# Patient Record
Sex: Female | Born: 1940
Health system: Southern US, Community
[De-identification: ages and names within clinical notes are randomized; demographics above are authoritative.]

## PROBLEM LIST (undated history)

## (undated) DIAGNOSIS — I10 Essential (primary) hypertension: Secondary | ICD-10-CM

## (undated) DIAGNOSIS — E119 Type 2 diabetes mellitus without complications: Secondary | ICD-10-CM

## (undated) DIAGNOSIS — E785 Hyperlipidemia, unspecified: Secondary | ICD-10-CM

## (undated) DIAGNOSIS — R002 Palpitations: Secondary | ICD-10-CM

## (undated) HISTORY — DX: Palpitations: R00.2

## (undated) HISTORY — DX: Essential (primary) hypertension: I10

## (undated) HISTORY — DX: Hyperlipidemia, unspecified: E78.5

## (undated) HISTORY — DX: Type 2 diabetes mellitus without complications: E11.9

## (undated) HISTORY — PX: TRANSTHORACIC ECHOCARDIOGRAM: SHX275

---

## 2002-03-04 ENCOUNTER — Other Ambulatory Visit: Admission: RE | Admit: 2002-03-04 | Discharge: 2002-03-04 | Payer: Self-pay | Admitting: Obstetrics and Gynecology

## 2003-08-05 ENCOUNTER — Other Ambulatory Visit: Admission: RE | Admit: 2003-08-05 | Discharge: 2003-08-05 | Payer: Self-pay | Admitting: Obstetrics and Gynecology

## 2004-11-02 ENCOUNTER — Other Ambulatory Visit: Admission: RE | Admit: 2004-11-02 | Discharge: 2004-11-02 | Payer: Self-pay | Admitting: Obstetrics and Gynecology

## 2005-10-17 ENCOUNTER — Encounter: Admission: RE | Admit: 2005-10-17 | Discharge: 2005-10-17 | Payer: Self-pay | Admitting: Endocrinology

## 2006-06-13 ENCOUNTER — Other Ambulatory Visit: Admission: RE | Admit: 2006-06-13 | Discharge: 2006-06-13 | Payer: Self-pay | Admitting: Obstetrics and Gynecology

## 2013-09-08 ENCOUNTER — Encounter: Payer: Self-pay | Admitting: Cardiology

## 2013-09-08 ENCOUNTER — Ambulatory Visit (INDEPENDENT_AMBULATORY_CARE_PROVIDER_SITE_OTHER): Payer: Medicare Other | Admitting: Cardiology

## 2013-09-08 VITALS — BP 160/80 | Ht 63.5 in | Wt 155.0 lb

## 2013-09-08 DIAGNOSIS — E785 Hyperlipidemia, unspecified: Secondary | ICD-10-CM

## 2013-09-08 DIAGNOSIS — I358 Other nonrheumatic aortic valve disorders: Secondary | ICD-10-CM

## 2013-09-08 DIAGNOSIS — E1169 Type 2 diabetes mellitus with other specified complication: Secondary | ICD-10-CM

## 2013-09-08 DIAGNOSIS — I359 Nonrheumatic aortic valve disorder, unspecified: Secondary | ICD-10-CM

## 2013-09-08 DIAGNOSIS — I10 Essential (primary) hypertension: Secondary | ICD-10-CM

## 2013-09-08 NOTE — Patient Instructions (Signed)
Your physician has requested that you have an echocardiogram. Echocardiography is a painless test that uses sound waves to create images of your heart. It provides your doctor with information about the size and shape of your heart and how well your heart's chambers and valves are working. This procedure takes approximately one hour. There are no restrictions for this procedure.  Your physician wants you to follow-up in 6 month Dr Herbie Baltimore.  You will receive a reminder letter in the mail two months in advance. If you don't receive a letter, please call our office to schedule the follow-up appointment.

## 2013-09-08 NOTE — Assessment & Plan Note (Signed)
Her blood pressure is not so well controlled today, she said was better at her primary's office earlier this week. He has been somewhat stressed coming into the office today, and forgot her medicines this morning. She says at home her blood pressures run in the 130s/70s mmHg.  I am inclined to monitor her pressures, and would not react with one reading. I would ask that she have blood pressure checked when she comes get her echocardiogram.

## 2013-09-08 NOTE — Progress Notes (Signed)
PATIENT: Kathy [name] MRN: [id number]  DOB: 12-Dec-1940   DOV:09/08/2013 PCP: Michiel Sites, MD  Clinic Note: Chief Complaint  Patient presents with  . Follow-up    Woke up Sunday could hardly move on lft side-like she pulled a muscle or slept wrong. C/o little ankle edema.    HPI: Kathy [name] is a 72 y.o.  female with a PMH below who presents today for what amounts to be a one-year followup for hypertension, and reported history of hypertensive cardiomyopathy in the past. This however it had been disproved by echocardiogram in 2010. She does have an aortic murmur with echo cardiac evidence of aortic sclerosis but no stenosis in 2010. I last saw her in October 2013, she is relatively stable at that time. Her diabetes and noted to be monitored by her primary physician. She does have labs today..  Interval History: She test today doing relatively well for cardiac standpoint. Her major complaint is that she woke up on Saturday with significant strain in the side of her neck. She's been troubled by a tendinitis in the left elbow and thinks that she may just telephone his sleeping. Other than that she really has no major symptoms. Mild arthralgias in her in her elbows shoulders and knees, but no chest tightness or pressure with or without shortness breath at rest or exertion. Minimal palpitations but no rapid heartbeats. No lightheadedness, dizziness, wooziness, or syncope/near-syncope. No TIA or amaurosis fugax symptoms. She does get occasional leg cramps, but denies any claudication. No melena, hematochezia or hematuria.  Past Medical History  Diagnosis Date  . HTN (hypertension), benign     Prior History of Presumed Hypertensive Cardiomyopathy with LVH - not confrimed by Echo in 2010  . Dyslipidemia   . DM type 2 (diabetes mellitus, type 2)   . Heart palpitations     No documented Arrhythmia    Prior Cardiac Evaluation and Past Surgical History: Past Surgical History    Procedure Laterality Date  . Transthoracic echocardiogram  2010    Normal LV thickness and size. EF 65-75%. Hyperdynamic. Much less notable LVH; mild aortic sclerosis with mild aortic insufficiency. No suggestion of hemodynamically significant stenosis.    Allergies  Allergen Reactions  . Corn-Containing Products   . Ibuprofen   . Penicillins   . Shellfish Allergy   . Soy Allergy     Current Outpatient Prescriptions  Medication Sig Dispense Refill  . aspirin EC 81 MG tablet Take 81 mg by mouth as needed.      . Cholecalciferol (VITAMIN D3) 2000 UNITS capsule Take 2,000 Units by mouth daily.      . Coenzyme Q10 (CO Q 10) 100 MG CAPS Take 1 capsule by mouth daily.      Marland Kitchen glyBURIDE-metformin (GLUCOVANCE) 2.5-500 MG per tablet Take 2 tablets by mouth daily with breakfast.      . Multiple Vitamin (MULTIVITAMIN) tablet Take 1 tablet by mouth daily.      . nebivolol (BYSTOLIC) 10 MG tablet Take 10 mg by mouth daily.      . Omega-3 Fatty Acids (EMULSIFIED OMEGA-3) 409-8119 MG/15ML LIQD Takes 2 teaspoons by mouth daily.      . rosuvastatin (CRESTOR) 10 MG tablet Take 10 mg by mouth daily.      . valsartan-hydrochlorothiazide (DIOVAN-HCT) 320-12.5 MG per tablet Take 1 tablet by mouth daily.       No current facility-administered medications for this visit.    History   Social History Narrative  Married, mother of 2.   He does not smoke or drink alcohol.   She tries to exercise, has been not as compliant as she had in the past.         ROS: A comprehensive Review of Systems - Negative except Symptoms noted in history of present illness, most notably the left neck tension/muscle strain.  PHYSICAL EXAM BP 160/80  Ht 5' 3.5" (1.613 m)  Wt 155 lb (70.308 kg)  BMI 27.02 kg/m2 General appearance: Alert and awake x3 healthy-appearing woman in no acute distress. Well-nourished and well-groomed. Answers questions appropriately HEENT: [place]/AT, EOMI, MMM, anicteric sclera Neck: no  adenopathy, no carotid bruit, no JVD; supple, with full range of motion. There is significant muscle tension along the lesser, mastoid and trapezius muscles. Radiated murmur Lungs: clear to auscultation bilaterally, normal percussion bilaterally and Nonlabored, good air movement Heart: normal apical impulse, regular rate and rhythm, S1, S2 normal, no S3 or S4 and 2/6 early peaking crescendo-decrescendo SEM heard at the RUSB Abdomen: soft, non-tender; bowel sounds normal; no masses,  no organomegaly Extremities: extremities normal, atraumatic, no cyanosis or edema Pulses: 2+ and symmetric Neurologic: Grossly normal  QIO:[id number] today: Yes Rate: 83 , Rhythm: NSR, normal ECG;  Recent Labs: 08/18/2013  CBC: BC 6.0, Hgb 10.5, platelets 223  LFTs: Normal  Creatinine 1.05 (GFR greater than 59); glucose 184 (compared to 248 in September)  Total cholesterol 112, HDL 35 , LDL 62, triglycerides 77 reduced from (207, 40,144, 116 respectively)   ASSESSMENT / PLAN: Aortic heart murmur Although her previous echocardiogram was read as having aortic sclerosis, my previous examinations did not note a significant murmur in the aortic region. This is the first time did notice and was at least 2-3/6 which would suggest that sclerosis has progressed to stenosis.  Plan: Recheck transthoracic echocardiogram to assess status of aortic valve with worsening murmur  HTN (hypertension) Her blood pressure is not so well controlled today, she said was better at her primary's office earlier this week. He has been somewhat stressed coming into the office today, and forgot her medicines this morning. She says at home her blood pressures run in the 130s/70s mmHg.  I am inclined to monitor her pressures, and would not react with one reading. I would ask that she have blood pressure checked when she comes get her echocardiogram.  Dyslipidemia associated with type 2 diabetes mellitus Relatively well controlled on most  recent labs. The only level not at goal would be the HDL, and that reduced level may simply be because of total cholesterol being significantly reduced.  She is on Crestor along with IV fatty acids and coenzyme Q10, and tolerating the regimen relatively well-appearing    Orders Placed This Encounter  Procedures  . EKG 12-Lead  . 2D Echocardiogram without contrast    Standing Status: Future     Number of Occurrences:      Standing Expiration Date: 09/08/2014    Order Specific Question:  Type of Echo    Answer:  Complete    Order Specific Question:  Where should this test be performed    Answer:  [facility]    Order Specific Question:  Reason for exam-Echo    Answer:  Murmur  785.2    Followup: 6 months  DAVID W. Herbie Baltimore, M.D., M.S. [facility] [address] [place], [place]  [address]  [phone] Pager # [phone]

## 2013-09-08 NOTE — Assessment & Plan Note (Signed)
Relatively well controlled on most recent labs. The only level not at goal would be the HDL, and that reduced level may simply be because of total cholesterol being significantly reduced.  She is on Crestor along with IV fatty acids and coenzyme Q10, and tolerating the regimen relatively well-appearing

## 2013-09-08 NOTE — Assessment & Plan Note (Signed)
Although her previous echocardiogram was read as having aortic sclerosis, my previous examinations did not note a significant murmur in the aortic region. This is the first time did notice and was at least 2-3/6 which would suggest that sclerosis has progressed to stenosis.  Plan: Recheck transthoracic echocardiogram to assess status of aortic valve with worsening murmur

## 2013-09-16 ENCOUNTER — Telehealth (HOSPITAL_COMMUNITY): Payer: Self-pay | Admitting: *Deleted

## 2013-09-16 NOTE — Telephone Encounter (Signed)
I have no idea what this medication is.  It is a new - not yet on [facility]. The only potential interaction with her current medications is with the HCTZ component of her Valsartan (Diovan)-HCTZ.     I have no experience with this medication & would defer to her PCP.  Please help her get echo rescheduled.  Marykay Lex, MD

## 2013-09-16 NOTE — Telephone Encounter (Signed)
Pt states that she was prescribed Jardiance 10mg  by another physician and her body is not reacting well to it. She would like to speak with Dr. Herbie Baltimore about it. Please call

## 2013-09-16 NOTE — Telephone Encounter (Signed)
Returned call and pt verified x 2.  Pt stated she was supposed to be scheduled for an echocardiogram and never received a call to schedule it.  Pt informed she will be transferred to [facility] who schedules testing to have this scheduled.  Pt also informed message received about her medication.  Pt stated she wanted to let Dr. Herbie Baltimore know the name of the medication, Jardiance, b/c they discussed this at her appt last week.  Stated her PCP started her on this and she took it for 3 days and stopped.  Stated she wanted to let Dr. Herbie Baltimore know so he could give her a second opinion on this med, if she should be taking it or not and if it will interact with anything else she is taking.  Pt informed Dr. Herbie Baltimore will be notified.  Pt verbalized understanding and agreed w/ plan.  Pt transferred to [facility].  Message forwarded to Dr. Herbie Baltimore.

## 2013-09-16 NOTE — Telephone Encounter (Signed)
Returned call and informed pt per instructions by MD.  Pt verbalized understanding and agreed w/ plan.   Dr. Herbie Baltimore - This is a new medication.  I had to look it up online.  Also, she was scheduled for the echo.

## 2013-09-19 ENCOUNTER — Encounter: Payer: Self-pay | Admitting: Cardiology

## 2013-09-20 NOTE — Telephone Encounter (Signed)
Thanks.  I looked it up, but have no idea as to the side effects etc.  Her PCP prescribed the medication.  I defer to the PCP.  Leonie Man, MD

## 2013-09-29 ENCOUNTER — Ambulatory Visit (HOSPITAL_COMMUNITY)
Admission: RE | Admit: 2013-09-29 | Discharge: 2013-09-29 | Disposition: A | Discharge status: Home / Self Care | Payer: Medicare HMO | Source: Ambulatory Visit | Attending: Cardiology | Admitting: Cardiology

## 2013-09-29 DIAGNOSIS — I358 Other nonrheumatic aortic valve disorders: Secondary | ICD-10-CM

## 2013-09-29 DIAGNOSIS — E119 Type 2 diabetes mellitus without complications: Secondary | ICD-10-CM | POA: Insufficient documentation

## 2013-09-29 DIAGNOSIS — R002 Palpitations: Secondary | ICD-10-CM | POA: Insufficient documentation

## 2013-09-29 DIAGNOSIS — R011 Cardiac murmur, unspecified: Secondary | ICD-10-CM

## 2013-09-29 DIAGNOSIS — I359 Nonrheumatic aortic valve disorder, unspecified: Secondary | ICD-10-CM | POA: Diagnosis not present

## 2013-09-29 DIAGNOSIS — I10 Essential (primary) hypertension: Secondary | ICD-10-CM | POA: Insufficient documentation

## 2013-09-29 DIAGNOSIS — E785 Hyperlipidemia, unspecified: Secondary | ICD-10-CM | POA: Insufficient documentation

## 2013-09-29 NOTE — Progress Notes (Signed)
2D Echo Performed 09/29/2013    Brennah Quraishi, RCS  

## 2013-09-30 ENCOUNTER — Telehealth: Payer: Self-pay | Admitting: *Deleted

## 2013-09-30 NOTE — Telephone Encounter (Signed)
Spoke to patient.  ECHO Result given . Verbalized understanding  

## 2013-09-30 NOTE — Telephone Encounter (Signed)
Message copied by Raiford Simmonds on Tue Sep 30, 2013  6:13 PM ------      Message from: Leonie Man      Created: Mon Sep 29, 2013  5:49 PM       Good news - normal pump function.  The Aortic valve does not have signs of narrowing.  It is a bit "leaky", but not significant.  Will simply monitor the murmur & symptoms.      Leonie Man, MD       ------

## 2013-10-15 ENCOUNTER — Encounter: Payer: Self-pay | Admitting: *Deleted

## 2013-11-10 ENCOUNTER — Telehealth: Payer: Self-pay | Admitting: Cardiology

## 2013-11-10 NOTE — Telephone Encounter (Signed)
Easiest course of action is to do a PA for Brand Name Diovan without HCTZ.  Otherwise, will need to convert to a different ARB like Irbesartan.   Will forward to [place] to see if she ideas.  If we do this, should have a BP follow-up after changing.  Leonie Man, MD

## 2013-11-10 NOTE — Telephone Encounter (Signed)
Has questions about a medication in which she is taking , her insurance does not want pay for the brand name , the generic form of diovan kept her dizzy and she kept falling , and the insurance needs a reason to show why she cant take the generic form of Diovan .Marland Kitchen Please Call .Marland Kitchen    Thanks

## 2013-11-10 NOTE — Telephone Encounter (Signed)
Returned call and pt verified x 2.  Informed pt message received and forwarded to Dr. Sharlyn Bologna, RN.  Pt stated she has a few more days of medication left.  Stated Dr. Ellyn Hack had her changed from the generic b/c she was having dizziness and falls when taking it.  Stated she thinks it's the fillers in the generic that she has a problem w/ and cannot take it.    Last Rx was written by PCP at her annual visit, but stated Dr. Ellyn Hack is the one who initially prescribed it.    Message forwarded to Dr. Sharlyn Bologna, RN.  Sound like pt needs PA for Brand Name Diovan HCT.

## 2013-11-17 NOTE — Telephone Encounter (Signed)
Verified ID # with patient,  Tried to run PA last week (Wednesday), came back as pt not on insurance.  Will try again today.

## 2013-11-18 MED ORDER — CANDESARTAN CILEXETIL-HCTZ 32-12.5 MG PO TABS: 1.0000 | ORAL_TABLET | Freq: Every day | ORAL | Status: DC

## 2013-11-18 NOTE — Telephone Encounter (Signed)
Spoke with patient, [employer] denied PA.  Advised pt we would try generic of another similar medication.  She states that she has had multiple problems in the past with other classes of medications, including beta blocker and calcium channel blockers.  I explained that we would use irbeasrtan hct.  She then stated that part of the problem lies in tablet size and she won't take any "large" tablets as she has swallowing difficulties.  So I will start her on candesartan/hct in hopes that the tablet size is agreeable with her.  [facility] day [facility]  I did advise her to take it for at least 2 weeks before determining if it was not acceptable.

## 2013-12-26 ENCOUNTER — Telehealth: Payer: Self-pay | Admitting: Cardiology

## 2013-12-26 NOTE — Telephone Encounter (Signed)
Message forwarded to Kristin Alvstad, PharmD.  

## 2013-12-26 NOTE — Telephone Encounter (Signed)
Has a question about Atacand/HCTZ 32/12.5 mg and has been on Diovan 320/12.5 mg and she does not believe that they are equivalent and want the pharmacist to call and ask if Dr.Harding call in the right medication for her . And if you prefer you can call the patient directly and let her know , because everything at the pharmacy is good . But any questions you can call the pharmacist Marshia Ly at [phone].   Thanks

## 2013-12-26 NOTE — Telephone Encounter (Signed)
Pt was confused about switching from a 320 mg drug to 32 mg drug.  Explained that these doses are equivalent and that it was not an error by the pharmacy.  Pt voiced understanding and said she would call if she develops problems with the Atacand HCT, which she will start today.

## 2014-02-23 ENCOUNTER — Other Ambulatory Visit: Payer: Self-pay | Admitting: *Deleted

## 2014-02-23 ENCOUNTER — Telehealth: Payer: Self-pay | Admitting: Cardiology

## 2014-02-23 MED ORDER — CANDESARTAN CILEXETIL-HCTZ 32-12.5 MG PO TABS: 1.0000 | ORAL_TABLET | Freq: Every day | ORAL | Status: DC

## 2014-02-23 NOTE — Telephone Encounter (Signed)
RN spoke to patient. She states she has taken medication for the last #30 days.she states it makes her anxious and sick.  She has tried different times of the day she still has the same experience.  She would like to keep taking the medication until she sees Dr Ellyn Hack at office appointment.  RN asked if patient took b/p today, she states no. Patient  states she need medication for today.  Prescription  E-sent earlier in the day.

## 2014-02-23 NOTE — Telephone Encounter (Signed)
Forward to Kathy [name]

## 2014-02-23 NOTE — Telephone Encounter (Signed)
Make sure that Bystolic & Diovan are taken at 12 hr interval between/

## 2014-02-23 NOTE — Telephone Encounter (Signed)
Patient needs to talk about the Cardesartem/HCTZ  32-12.5 that she is taking.  She states that it makes her sick and nervous.   Please call.

## 2014-02-23 NOTE — Telephone Encounter (Signed)
Called patient no answer.will place back in triage pool

## 2014-02-24 NOTE — Telephone Encounter (Signed)
Spoke to patient. Per Dr Ellyn Hack, CLARIFICATION  -patient is taking (GENERIC-ATCAND-HCTZ) not DIOVAN.  APPOINTMENT IHAS BEEN MOVED UP TO 03/10/14-11:45 PM - PATIENT AWARE. RN INFORMED PATIENT PER DR HARDING TO TAKE 12 HOURS  APART FROM BYSTOLIC. SHE VERBALIZED UNDERSTANDING.

## 2014-03-10 ENCOUNTER — Ambulatory Visit: Payer: Medicare HMO | Admitting: Cardiology

## 2014-03-16 ENCOUNTER — Ambulatory Visit: Payer: Medicare HMO | Admitting: Cardiology

## 2014-03-31 ENCOUNTER — Telehealth: Payer: Self-pay | Admitting: Cardiology

## 2014-03-31 NOTE — Telephone Encounter (Signed)
Pt need her Candesartan  HCTZ 32/12.5 #30. Pharmacist says she needs  prior authorization. [phone].

## 2014-04-01 NOTE — Telephone Encounter (Signed)
Prior authorization form faxed to [place] for approval

## 2014-04-09 ENCOUNTER — Telehealth: Payer: Self-pay | Admitting: *Deleted

## 2014-04-09 NOTE — Telephone Encounter (Signed)
Called and verified with pharmacy - medication was approved per  pharmacy personnel  Medication was filled on 04/01/15

## 2014-04-13 ENCOUNTER — Ambulatory Visit: Payer: Medicare HMO | Admitting: Cardiology

## 2014-05-18 ENCOUNTER — Ambulatory Visit: Payer: Medicare HMO | Admitting: Cardiology

## 2014-05-26 ENCOUNTER — Ambulatory Visit (INDEPENDENT_AMBULATORY_CARE_PROVIDER_SITE_OTHER): Payer: PRIVATE HEALTH INSURANCE | Admitting: Cardiology

## 2014-05-26 VITALS — BP 168/82 | HR 82 | Ht 64.0 in | Wt 153.4 lb

## 2014-05-26 DIAGNOSIS — I358 Other nonrheumatic aortic valve disorders: Secondary | ICD-10-CM

## 2014-05-26 DIAGNOSIS — E1169 Type 2 diabetes mellitus with other specified complication: Secondary | ICD-10-CM

## 2014-05-26 DIAGNOSIS — E785 Hyperlipidemia, unspecified: Secondary | ICD-10-CM

## 2014-05-26 DIAGNOSIS — I10 Essential (primary) hypertension: Secondary | ICD-10-CM

## 2014-05-26 DIAGNOSIS — I359 Nonrheumatic aortic valve disorder, unspecified: Secondary | ICD-10-CM

## 2014-05-26 MED ORDER — DIOVAN HCT 320-25 MG PO TABS: 1.0000 | ORAL_TABLET | Freq: Every day | ORAL | Status: DC

## 2014-05-26 NOTE — Patient Instructions (Addendum)
CHANGE  (BRAND ) DIOVAN HCT 320/25 MG   STOP CANDESARTAN-HCT  Your physician wants you to follow-up in [place] will receive a reminder letter in the mail two months in advance. If you don't receive a letter, please call our office to schedule the follow-up appointment.

## 2014-05-31 ENCOUNTER — Encounter: Payer: Self-pay | Admitting: Cardiology

## 2014-05-31 NOTE — Assessment & Plan Note (Signed)
Not adequately controlled. Unfortunately, she has this presumption that generic medications do not go along well with her. As a very unfortunate because we had to do a lot of paperwork is to get her approved for the Atacand and HCTZ. At this point in time this class of medication is best to do for her blood pressure control. We will try to get her back on Diovan HCT at her previous dose. Continue with diastolic of 10 mg daily which could be increased to 20 mg if her blood pressure still is elevated.

## 2014-05-31 NOTE — Assessment & Plan Note (Signed)
Repeat echo showed less aortic sclerosis than last time. She does have some mild AI but does not we heard. And minimal disease, noted at all concerned. We will recheck if murmur is louder.

## 2014-05-31 NOTE — Progress Notes (Signed)
PCP: Dwan Bolt, MD  Clinic Note: Chief Complaint  Patient presents with  . Follow-up    6 months follow-up, pt stated candesartan does not work well for her it make her feel dizzy and lightheaded and feel like she;s walking side ways.   HPI: Kathy [name] is a 73 y.o. female with a PMH below who presents today for 9 month followup of hypertension with presumed hypertensive cardiomyopathy that is likely inaccurate based on most recent echo. She has a history of intolerance to generic medications. Since her last visit we had switched her from trade name Diovan-HCTZ to generic candesartan-HCTZ due to her insurance coverage. Unfortunately she feels that she is having a bad interaction with the "filler ingredients of the generic medication".  Past Medical History  Diagnosis Date  . HTN (hypertension), benign     Prior History of Presumed Hypertensive Cardiomyopathy with LVH - not confrimed by Echo in 2010 & 2015  . Dyslipidemia   . DM type 2 (diabetes mellitus, type 2)   . Heart palpitations     No documented Arrhythmia   Prior Cardiac Evaluation and Past Surgical History: Past Surgical History  Procedure Laterality Date  . Transthoracic echocardiogram  2010; 09/2013    Normal LV thickness and size. EF 65-75%. Hyperdynamic. Much less notable LVH; mild aortic sclerosis with mild aortic insufficiency. No suggestion of hemodynamically significant stenosis.; b) 2015 - NormalLV size & function, EF 60-65% with Gr 1 DD, mild Ao Sclerosis with mild-mod AI    Interval History: She states that she is "just not been feeling good since we changed the medications. "She feels as though she is lightheaded and dizzy, walking sideways". She feels short of breath at rest, not associated with exertion.. She has shooting pains down her left arm on occasion. She has not had any syncope or near syncopal episodes. No TIA/amaurosis fugax or CVA symptoms. No chest tightness or pressure with rest or  exertion. No PND, orthopnea or edema. No claudication. She has not noted any significant palpitations are rapid slightly irregular heartbeats. She is under great stress because her husband is in the hospital.  ROS: A comprehensive Review of Systems - was performed Review of Systems  Constitutional: Positive for malaise/fatigue. Negative for fever, chills, weight loss and diaphoresis.  HENT: Negative for congestion, nosebleeds and tinnitus.   Eyes: Positive for blurred vision. Negative for double vision, photophobia and pain.  Respiratory: Positive for shortness of breath. Negative for cough, hemoptysis, sputum production and wheezing.   Cardiovascular: Positive for claudication.       Per history of present illness  Gastrointestinal: Positive for nausea. Negative for vomiting, abdominal pain, diarrhea, constipation, blood in stool and melena.  Neurological: Positive for dizziness, tingling and weakness. Negative for tremors, sensory change, speech change, focal weakness, seizures, loss of consciousness and headaches.  Endo/Heme/Allergies: Negative.  Does not bruise/bleed easily.  Psychiatric/Behavioral: Negative for depression, suicidal ideas, hallucinations, memory loss and substance abuse. The patient is nervous/anxious. The patient does not have insomnia.   All other systems reviewed and are negative.  MEDICATIONS AND ALLERGIES REVIEWED IN EPIC --  ON Candasartan-HCTZ 32-12.5 in lieu of Diovan-HCT 320-12.5 (trade name)  SOCIAL AND FAMILY HISTORY REVIEWED IN EPIC -- No change  Wt Readings from Last 3 Encounters:  05/26/14 153 lb 6.4 oz (69.582 kg)  09/08/13 155 lb (70.308 kg)   PHYSICAL EXAM BP 168/82  Pulse 82  Ht 5\' 4"  (1.626 m)  Wt 153 lb 6.4 oz (69.582  kg)  BMI 26.32 kg/m2 General appearance: Alert and awake x3 healthy-appearing woman in no acute distress. Well-nourished and well-groomed. Answers questions appropriately  HEENT: [place]/AT, EOMI, MMM, anicteric sclera  Neck: no  adenopathy, no carotid bruit, no JVD; supple, with full range of motion. There is significant muscle tension along the lesser, mastoid and trapezius muscles. Radiated murmur  Lungs: clear to auscultation bilaterally, normal percussion bilaterally and Nonlabored, good air movement  Heart: normal apical impulse, regular rate and rhythm, S1, S2 normal, no S3 or S4 and 2/6 early peaking crescendo-decrescendo SEM heard at the RUSB  Abdomen: soft, non-tender; bowel sounds normal; no masses, no organomegaly  Extremities: extremities normal, atraumatic, no cyanosis or edema  Pulses: 2+ and symmetric  Neurologic: Grossly normal   Adult ECG Report  Rate: 82 ;  Rhythm: normal sinus rhythm  Narrative Interpretation: Normal EKG.  No change from previous  Recent Labs: 08/18/2013  CBC: BC 6.0, Hgb 10.5, platelets 223  LFTs: Normal  Creatinine 1.05 (GFR greater than 59); glucose 184 (compared to 248 in September)  Total cholesterol 112, HDL 35 , LDL 62, triglycerides 77 reduced from (207, 40,144, 116 respectively)    ASSESSMENT / PLAN: Essential hypertension, malignant Not adequately controlled. Unfortunately, she has this presumption that generic medications do not go along well with her. As a very unfortunate because we had to do a lot of paperwork is to get her approved for the Atacand and HCTZ. At this point in time this class of medication is best to do for her blood pressure control. We will try to get her back on Diovan HCT at her previous dose. Continue with diastolic of 10 mg daily which could be increased to 20 mg if her blood pressure still is elevated.  Aortic heart murmur Repeat echo showed less aortic sclerosis than last time. She does have some mild AI but does not we heard. And minimal disease, noted at all concerned. We will recheck if murmur is louder.  Dyslipidemia associated with type 2 diabetes mellitus Lipid panel looked pretty well back in December. Upon going to check her once a  year. Continue Crestor with omega-3 fatty acids and Co Q10.    Orders Placed This Encounter  Procedures  . EKG 12-Lead   Meds ordered this encounter  Medications  . DIOVAN HCT 320-25 MG per tablet    Sig: Take 1 tablet by mouth daily.    Dispense:  30 tablet    Refill:  11    Followup: RN BP check in ~1 month.  6 month with MD   Leonie Man, M.D., M.S. Interventional Cardiologist   Pager # [phone]

## 2014-05-31 NOTE — Assessment & Plan Note (Signed)
Lipid panel looked pretty well back in December. Upon going to check her once a year. Continue Crestor with omega-3 fatty acids and Co Q10.

## 2014-06-12 ENCOUNTER — Other Ambulatory Visit: Payer: Self-pay | Admitting: Cardiology

## 2014-06-12 NOTE — Telephone Encounter (Signed)
E sentrx 

## 2014-06-30 ENCOUNTER — Telehealth: Payer: Self-pay | Admitting: *Deleted

## 2014-06-30 NOTE — Telephone Encounter (Signed)
NOTIFIED PHARMACY DIOVAN -HCT (BRAND) HAS BEEN APRROVED

## 2014-06-30 NOTE — Telephone Encounter (Signed)
Spoke to representative-Crystal Information given - ICD-10--I10 for malignant hypertension ,patient tried generic VALSARTAN- (PATIENT IS INTOLERANT TO GENERIC MEDICATIONS ) PRIOR APPROVED. FOR  A YEAR.

## 2014-12-03 DIAGNOSIS — E11329 Type 2 diabetes mellitus with mild nonproliferative diabetic retinopathy without macular edema: Secondary | ICD-10-CM | POA: Diagnosis not present

## 2014-12-03 DIAGNOSIS — H524 Presbyopia: Secondary | ICD-10-CM | POA: Diagnosis not present

## 2014-12-14 DIAGNOSIS — E118 Type 2 diabetes mellitus with unspecified complications: Secondary | ICD-10-CM | POA: Diagnosis not present

## 2014-12-14 DIAGNOSIS — E789 Disorder of lipoprotein metabolism, unspecified: Secondary | ICD-10-CM | POA: Diagnosis not present

## 2014-12-21 DIAGNOSIS — E118 Type 2 diabetes mellitus with unspecified complications: Secondary | ICD-10-CM | POA: Diagnosis not present

## 2014-12-21 DIAGNOSIS — E789 Disorder of lipoprotein metabolism, unspecified: Secondary | ICD-10-CM | POA: Diagnosis not present

## 2014-12-21 DIAGNOSIS — I10 Essential (primary) hypertension: Secondary | ICD-10-CM | POA: Diagnosis not present

## 2014-12-28 ENCOUNTER — Ambulatory Visit (INDEPENDENT_AMBULATORY_CARE_PROVIDER_SITE_OTHER): Payer: Medicare Other | Admitting: Cardiology

## 2014-12-28 VITALS — BP 134/80 | HR 78 | Ht 64.0 in | Wt 150.1 lb

## 2014-12-28 DIAGNOSIS — E1169 Type 2 diabetes mellitus with other specified complication: Secondary | ICD-10-CM

## 2014-12-28 DIAGNOSIS — E785 Hyperlipidemia, unspecified: Secondary | ICD-10-CM

## 2014-12-28 DIAGNOSIS — R002 Palpitations: Secondary | ICD-10-CM | POA: Diagnosis not present

## 2014-12-28 DIAGNOSIS — I359 Nonrheumatic aortic valve disorder, unspecified: Secondary | ICD-10-CM

## 2014-12-28 DIAGNOSIS — I358 Other nonrheumatic aortic valve disorders: Secondary | ICD-10-CM

## 2014-12-28 DIAGNOSIS — I10 Essential (primary) hypertension: Secondary | ICD-10-CM

## 2014-12-28 MED ORDER — DIOVAN HCT 320-12.5 MG PO TABS: 1.0000 | ORAL_TABLET | Freq: Every day | ORAL | Status: DC

## 2014-12-28 MED ORDER — VALSARTAN-HYDROCHLOROTHIAZIDE 320-12.5 MG PO TABS: 1.0000 | ORAL_TABLET | Freq: Every day | ORAL | Status: DC

## 2014-12-28 NOTE — Progress Notes (Signed)
PCP: Dwan Bolt, MD  Clinic Note: Chief Complaint  Patient presents with  . Follow-up    6 Months follow-up  . Hypertension  . Palpitations    HPI: Kathy [name] is a 74 y.o. female with a PMH below who presents today for 6 month follow-up for HTN, palpitations & HLD.  Past Medical History  Diagnosis Date  . HTN (hypertension), benign     Prior History of Presumed Hypertensive Cardiomyopathy with LVH - not confrimed by Echo in 2010 & 2015  . Dyslipidemia   . DM type 2 (diabetes mellitus, type 2)   . Heart palpitations     No documented Arrhythmia    Prior Cardiac Evaluation and Past Surgical History: Past Surgical History  Procedure Laterality Date  . Transthoracic echocardiogram  2010; 09/2013    Normal LV thickness and size. EF 65-75%. Hyperdynamic. Much less notable LVH; mild aortic sclerosis with mild aortic insufficiency. No suggestion of hemodynamically significant stenosis.; b) 2015 - NormalLV size & function, EF 60-65% with Gr 1 DD, mild Ao Sclerosis with mild-mod AI    Interval History: She seems to be doing relatively well herself. Her psychological symptoms seem to be better control with her depression. She still is under a lot of stress which MAKES her blood sugars go up. She has been doing more exercising try to take her husband walking at the mall walking around stores like [employer] or [place]. Perhaps a limited most significant stressors for her involved her husband who is recovering from or dealing with ischemic cardiomyopathy. He is finally now agreed to be on antidepressant. She is actively trying to see if she can get him in to see a different cardiologist because she feels that he should be seen more frequently than he is being seen.  From a cardiology standpoint. She denies any resting or exertional chest pain or pressure.  She does have mild intermittent palpitations but are much better when she is less stressed. When she does have levels of stress  or anxiety or she gets upset she will notice more palpitations. She also notes more palpitations if she feels herself becoming more dry and dehydrated. Otherwise for instance when she is walking or exercising she denies any palpitations. No shortness of breath with rest or exertion.  No PND, orthopnea or edema. No lightheadedness, dizziness, weakness,syncope/near syncope, or TIA/amaurosis fugax symptoms.  ROS: A comprehensive was performed. Review of Systems  Constitutional: Positive for weight loss (Partially all-purpose. She has been exercising and trying to watch her diet.).  Respiratory: Negative for cough, shortness of breath and wheezing.   Cardiovascular: Negative for claudication and leg swelling.       Feels dehydrated  Gastrointestinal: Negative for blood in stool and melena.  Genitourinary: Negative for hematuria.       Feels somewhat dry dehydrated  Neurological: Negative for dizziness.  Endo/Heme/Allergies: Does not bruise/bleed easily.  Psychiatric/Behavioral: Positive for depression. The patient is nervous/anxious. The patient does not have insomnia.   All other systems reviewed and are negative.   Current Outpatient Prescriptions on File Prior to Visit  Medication Sig Dispense Refill  . aspirin EC 81 MG tablet Take 81 mg by mouth as needed.    Marland Kitchen BYSTOLIC 10 MG tablet TAKE 1 TABLET EVERY DAY 90 tablet 3  . Cholecalciferol (VITAMIN D3) 2000 UNITS capsule Take 2,000 Units by mouth daily.    . Coenzyme Q10 (CO Q 10) 100 MG CAPS Take 1 capsule by mouth daily.    Marland Kitchen  glyBURIDE-metformin (GLUCOVANCE) 2.5-500 MG per tablet Take 2 tablets by mouth daily with breakfast.    . Multiple Vitamin (MULTIVITAMIN) tablet Take 1 tablet by mouth daily.    . Omega-3 Fatty Acids (EMULSIFIED OMEGA-3) [id number] MG/15ML LIQD Takes 2 teaspoons by mouth daily.    . rosuvastatin (CRESTOR) 10 MG tablet Take 10 mg by mouth daily.     No current facility-administered medications on file prior to visit.     Allergies  Allergen Reactions  . Corn-Containing Products   . Ibuprofen   . Penicillins   . Shellfish Allergy   . Soy Allergy    History  Substance Use Topics  . Smoking status: Never Smoker   . Smokeless tobacco: Never Used  . Alcohol Use: No   Family History  Problem Relation Age of Onset  . Diabetes Brother   . Arrhythmia Brother   . Diabetes Brother     Wt Readings from Last 3 Encounters:  12/28/14 150 lb 1.6 oz (68.085 kg)  05/26/14 153 lb 6.4 oz (69.582 kg)  09/08/13 155 lb (70.308 kg)    PHYSICAL EXAM BP 134/80 mmHg  Pulse 78  Ht 5\' 4"  (1.626 m)  Wt 150 lb 1.6 oz (68.085 kg)  BMI 25.75 kg/m2 General appearance: Alert and awake x3 healthy-appearing woman in no acute distress. Well-nourished and well-groomed. Answers questions appropriately  HEENT: [place]/AT, EOMI, MMM, anicteric sclera  Neck: no adenopathy, no carotid bruit, no JVD; supple, with full range of motion. There is significant muscle tension along the lesser, mastoid and trapezius muscles. Radiated murmur  Lungs: clear to auscultation bilaterally, normal percussion bilaterally and Nonlabored, good air movement  Heart: normal apical impulse, regular rate and rhythm, S1, S2 normal, no S3 or S4 and 2/6 early peaking crescendo-decrescendo SEM heard at the RUSB  Abdomen: soft, non-tender; bowel sounds normal; no masses, no organomegaly  Extremities: extremities normal, atraumatic, no cyanosis or edema  Pulses: 2+ and symmetric  Neurologic: Grossly normal   Adult ECG Report  Rate: 78 ;  Rhythm: normal sinus rhythm and With normal axis, intervals and durations. Normal voltage  Narrative Interpretation: Normal EKG  Recent Labs:   Labs just checked by PCP. Numbers are not available.   ASSESSMENT / PLAN: Problem List Items Addressed This Visit    Aortic heart murmur (Chronic)    Benign finding of aortic sclerosis. Mild AI not heard on exam. Would only recheck echo if the murmur becomes more prominent.       Relevant Orders   EKG 12-Lead (Completed)   Dyslipidemia associated with type 2 diabetes mellitus (Chronic)    On Crestor and omega-3 fatty acids plus CoQ10-- tolerating well      Relevant Medications   saxagliptin HCl (ONGLYZA) 5 MG TABS tablet   DIOVAN HCT 320-12.5 MG per tablet   Other Relevant Orders   EKG 12-Lead (Completed)   Heart palpitations (Chronic)    Well-controlled on Bystolic, Which works well as she does not have much fatigue      Moderate essential hypertension - Primary    Blood pressure looks good today on current regimen. She is on stable dose of Bystolic which is helpful for palpitations and is better off on the Diovan HCTZ, however she is feeling somewhat dry and dehydrated therefore we will decrease her to the 12.5 mg HCTZ component.      Relevant Medications   DIOVAN HCT 320-12.5 MG per tablet   Other Relevant Orders   EKG 12-Lead (Completed)  Orders Placed This Encounter  Procedures  . EKG 12-Lead   Meds ordered this encounter  Medications  . saxagliptin HCl (ONGLYZA) 5 MG TABS tablet    Sig: Take 5 mg by mouth daily.  Marland Kitchen DISCONTD: valsartan-hydrochlorothiazide (DIOVAN HCT) 320-12.5 MG per tablet    Sig: Take 1 tablet by mouth daily.    Dispense:  90 tablet    Refill:  3  . DIOVAN HCT 320-12.5 MG per tablet    Sig: Take 1 tablet by mouth daily.    Dispense:  90 tablet    Refill:  3    Followup: 6 months    HARDING, Leonie Green, M.D., M.S. Interventional Cardiologist   Pager # [phone]

## 2014-12-28 NOTE — Patient Instructions (Signed)
SWITCH TO DIOVAN HCT 320 /12.5 MG DAILY.  CONTINUE ALL OTHER MEDICATIONS.  Your physician wants you to follow-up in [place].  You will receive a reminder letter in the mail two months in advance. If you don't receive a letter, please call our office to schedule the follow-up appointment.

## 2014-12-30 ENCOUNTER — Encounter: Payer: Self-pay | Admitting: Cardiology

## 2014-12-30 DIAGNOSIS — R002 Palpitations: Secondary | ICD-10-CM | POA: Insufficient documentation

## 2014-12-30 NOTE — Assessment & Plan Note (Signed)
On Crestor and omega-3 fatty acids plus CoQ10-- tolerating well

## 2014-12-30 NOTE — Assessment & Plan Note (Signed)
Blood pressure looks good today on current regimen. She is on stable dose of Bystolic which is helpful for palpitations and is better off on the Diovan HCTZ, however she is feeling somewhat dry and dehydrated therefore we will decrease her to the 12.5 mg HCTZ component.

## 2014-12-30 NOTE — Assessment & Plan Note (Signed)
Well-controlled on Bystolic, Which works well as she does not have much fatigue

## 2014-12-30 NOTE — Assessment & Plan Note (Signed)
Benign finding of aortic sclerosis. Mild AI not heard on exam. Would only recheck echo if the murmur becomes more prominent.

## 2015-02-11 DIAGNOSIS — E118 Type 2 diabetes mellitus with unspecified complications: Secondary | ICD-10-CM | POA: Diagnosis not present

## 2015-02-18 DIAGNOSIS — E789 Disorder of lipoprotein metabolism, unspecified: Secondary | ICD-10-CM | POA: Diagnosis not present

## 2015-02-18 DIAGNOSIS — I10 Essential (primary) hypertension: Secondary | ICD-10-CM | POA: Diagnosis not present

## 2015-04-15 DIAGNOSIS — E789 Disorder of lipoprotein metabolism, unspecified: Secondary | ICD-10-CM | POA: Diagnosis not present

## 2015-04-15 DIAGNOSIS — E118 Type 2 diabetes mellitus with unspecified complications: Secondary | ICD-10-CM | POA: Diagnosis not present

## 2015-04-20 DIAGNOSIS — I10 Essential (primary) hypertension: Secondary | ICD-10-CM | POA: Diagnosis not present

## 2015-04-20 DIAGNOSIS — E119 Type 2 diabetes mellitus without complications: Secondary | ICD-10-CM | POA: Diagnosis not present

## 2015-04-20 DIAGNOSIS — E789 Disorder of lipoprotein metabolism, unspecified: Secondary | ICD-10-CM | POA: Diagnosis not present

## 2015-04-28 DIAGNOSIS — L821 Other seborrheic keratosis: Secondary | ICD-10-CM | POA: Diagnosis not present

## 2015-04-28 DIAGNOSIS — L218 Other seborrheic dermatitis: Secondary | ICD-10-CM | POA: Diagnosis not present

## 2015-04-28 DIAGNOSIS — L859 Epidermal thickening, unspecified: Secondary | ICD-10-CM | POA: Diagnosis not present

## 2015-04-28 DIAGNOSIS — D2361 Other benign neoplasm of skin of right upper limb, including shoulder: Secondary | ICD-10-CM | POA: Diagnosis not present

## 2015-06-10 ENCOUNTER — Encounter: Payer: Self-pay | Admitting: Cardiology

## 2015-06-10 ENCOUNTER — Ambulatory Visit (INDEPENDENT_AMBULATORY_CARE_PROVIDER_SITE_OTHER): Payer: Medicare Other | Admitting: Cardiology

## 2015-06-10 VITALS — BP 144/84 | HR 84 | Ht 64.0 in | Wt 152.6 lb

## 2015-06-10 DIAGNOSIS — I10 Essential (primary) hypertension: Secondary | ICD-10-CM

## 2015-06-10 DIAGNOSIS — I359 Nonrheumatic aortic valve disorder, unspecified: Secondary | ICD-10-CM

## 2015-06-10 DIAGNOSIS — R002 Palpitations: Secondary | ICD-10-CM

## 2015-06-10 DIAGNOSIS — I358 Other nonrheumatic aortic valve disorders: Secondary | ICD-10-CM

## 2015-06-10 MED ORDER — VALSARTAN-HYDROCHLOROTHIAZIDE 320-25 MG PO TABS: 1.0000 | ORAL_TABLET | Freq: Every day | ORAL | Status: DC

## 2015-06-10 NOTE — Progress Notes (Signed)
PCP: Dwan Bolt, MD  Clinic Note: Chief Complaint  Patient presents with  . Follow-up    no symptoms    HPI: Kathy [name] is a 74 y.o. female with a PMH below who presents today for HTN & palpitations & Aortic Sclerosis without stenosis. Dyslipidemia on Crestor/CoQ10. & Fish Oil.Casey Burkitt was last seen on in April 2016 - noted mild intermittent palpitations.  Trying to increase exercise.  Recent Hospitalizations: n/a  Studies Reviewed: n/a  Interval History: Overall doing quite well from a cardiovascular standpoint.  Has been under a lot of stress over the past few monthsn - so BP has trended up a bit.  Stress with younger son moved back into the house.  Sister has been very since [employer] Day -- chronic HF w/ h/o Rheumatic Heart disease). Only dizzy if she drinks a certain type of tea.  No chest pain or shortness of breath with rest or exertion. No PND, orthopnea or edema. No palpitations, lightheadedness, weakness or syncope/near syncope. No TIA/amaurosis fugax symptoms. No melena, hematochezia, hematuria, or epstaxis. No claudication.  Past Medical History  Diagnosis Date  . HTN (hypertension), benign     Prior History of Presumed Hypertensive Cardiomyopathy with LVH - not confrimed by Echo in 2010 & 2015  . Dyslipidemia   . DM type 2 (diabetes mellitus, type 2)   . Heart palpitations     No documented Arrhythmia    ROS: A comprehensive was performed. Review of Systems  Constitutional: Negative for malaise/fatigue.  Respiratory: Negative for shortness of breath.   Cardiovascular: Negative for claudication and leg swelling.  Gastrointestinal: Negative for blood in stool.  Genitourinary: Negative for hematuria.  Musculoskeletal: Negative.  Negative for back pain.  Neurological: Positive for dizziness (only with certain drinks).  Endo/Heme/Allergies: Does not bruise/bleed easily.  Psychiatric/Behavioral:       Stress as per HPI  All other  systems reviewed and are negative.   Past Surgical History  Procedure Laterality Date  . Transthoracic echocardiogram  2010; 09/2013    Normal LV thickness and size. EF 65-75%. Hyperdynamic. Much less notable LVH; mild aortic sclerosis with mild aortic insufficiency. No suggestion of hemodynamically significant stenosis.; b) 2015 - NormalLV size & function, EF 60-65% with Gr 1 DD, mild Ao Sclerosis with mild-mod AI   Prior to Admission medications   Medication Sig Start Date End Date Taking? Authorizing Provider  aspirin EC 81 MG tablet Take 81 mg by mouth as needed.   Yes Historical Provider, MD  BYSTOLIC 10 MG tablet TAKE 1 TABLET EVERY DAY 06/12/14  Yes Leonie Man, MD  Cholecalciferol (VITAMIN D3) 2000 UNITS capsule Take 2,000 Units by mouth daily.   Yes Historical Provider, MD  Coenzyme Q10 (CO Q 10) 100 MG CAPS Take 1 capsule by mouth daily.   Yes Historical Provider, MD  glyBURIDE-metformin (GLUCOVANCE) 2.5-500 MG per tablet Take 2 tablets by mouth daily with breakfast.   Yes Historical Provider, MD  Multiple Vitamin (MULTIVITAMIN) tablet Take 1 tablet by mouth daily.   Yes Historical Provider, MD  Omega-3 Fatty Acids (EMULSIFIED OMEGA-3) [id number] MG/15ML LIQD Takes 2 teaspoons by mouth daily.   Yes Historical Provider, MD  saxagliptin HCl (ONGLYZA) 5 MG TABS tablet Take 5 mg by mouth daily.   Yes Historical Provider, MD  DIOVAN HCT 320-12.5 MG per tablet Take 1 tablet by mouth daily. 12/28/14  Yes Leonie Man, MD  nebivolol (BYSTOLIC) 10 MG tablet Take 10 mg by  mouth daily.   Yes Historical Provider, MD   Allergies  Allergen Reactions  . Corn-Containing Products Anaphylaxis  . Ibuprofen Shortness Of Breath  . Peanuts [Peanut Oil] Anaphylaxis  . Penicillins Shortness Of Breath  . Shellfish Allergy Anaphylaxis  . Soy Allergy Anaphylaxis    Only sometimes    Social History   Social History  . Marital Status: Unknown    Spouse Name: N/A  . Number of Children: N/A  .  Years of Education: N/A   Social History Main Topics  . Smoking status: Never Smoker   . Smokeless tobacco: Never Used  . Alcohol Use: No  . Drug Use: No  . Sexual Activity: Not Asked   Other Topics Concern  . None   Social History Narrative   Married, mother of 2.   He does not smoke or drink alcohol.   She tries to exercise, has been not as compliant as she had in the past.         Family History  Problem Relation Age of Onset  . Diabetes Brother   . Arrhythmia Brother   . Diabetes Brother      Wt Readings from Last 3 Encounters:  06/10/15 152 lb 9.6 oz (69.219 kg)  12/28/14 150 lb 1.6 oz (68.085 kg)  05/26/14 153 lb 6.4 oz (69.582 kg)    PHYSICAL EXAM BP 144/84 mmHg  Pulse 84  Ht 5\' 4"  (1.626 m)  Wt 152 lb 9.6 oz (69.219 kg)  BMI 26.18 kg/m2  SpO2 98% General appearance: Alert and awake x3 healthy-appearing woman in no acute distress. Well-nourished and well-groomed. Answers questions appropriately  HEENT: [place]/AT, EOMI, MMM, anicteric sclera  Neck: no adenopathy, no carotid bruit, no JVD; supple, with full range of motion. There is significant muscle tension along the lesser, mastoid and trapezius muscles. Radiated murmur  Lungs: clear to auscultation bilaterally, normal percussion bilaterally and Nonlabored, good air movement  Heart: normal apical impulse, regular rate and rhythm, S1, S2 normal, no S3 or S4 and 2/6 early peaking crescendo-decrescendo SEM heard at the RUSB  Abdomen: soft, non-tender; bowel sounds normal; no masses, no organomegaly  Extremities: extremities normal, atraumatic, no cyanosis or edema  Pulses: 2+ and symmetric  Neurologic: Grossly normal   Adult ECG Report  Rate: 84 ;  Rhythm: normal sinus rhythm; Normal axis & intervals  Narrative Interpretation: Normal EKG   Other studies Reviewed: Additional studies/ records that were reviewed today include:  Recent Labs:  None available  ASSESSMENT / PLAN: Problem List Items  Addressed This Visit    Aortic heart murmur - Primary (Chronic)    Aortic stenosis - follow exam findings for change in murmur to ensure Sclerosis is not becoming Stenosis.      Relevant Medications   valsartan-hydrochlorothiazide (DIOVAN HCT) 320-25 MG per tablet   Other Relevant Orders   EKG 12-Lead (Completed)   Heart palpitations (Chronic)    Well controlled with Bystolic - no issues with fatigue      Moderate essential hypertension    BP relatively well controlled, but with her own acknowledgement, her BP was better controlled on Diovan-HCTZ 320-25 mg than with 12.5 mg HCTZ.  Has decided to drink more & go back to 320-25 mg On stable Bystolic dose - HR would potentially tolerate increasing dose to 20mg  if needed.      Relevant Medications   nebivolol (BYSTOLIC) 10 MG tablet   valsartan-hydrochlorothiazide (DIOVAN HCT) 320-25 MG per tablet   Other Relevant Orders  EKG 12-Lead (Completed)      Current medicines are reviewed at length with the patient today. (+/- concerns) - thinks that she would do better with 25 mg vs. 12.5 mg of HCTZ with Diovan.  The following changes have been made: convert to 320/25 mg  Studies Ordered:   Orders Placed This Encounter  Procedures  . EKG 12-Lead      Leonie Man, M.D., M.S. Interventional Cardiologist   Pager # [phone]

## 2015-06-10 NOTE — Patient Instructions (Addendum)
Increase Diovan to 320/25 mg daily   Your physician wants you to follow-up in: 6 months schedule same time as husband. You will receive a reminder letter in the mail two months in advance. If you don't receive a letter, please call our office to schedule the follow-up appointment.

## 2015-06-12 NOTE — Assessment & Plan Note (Signed)
Well controlled with Bystolic - no issues with fatigue

## 2015-06-12 NOTE — Assessment & Plan Note (Signed)
Aortic stenosis - follow exam findings for change in murmur to ensure Sclerosis is not becoming Stenosis.

## 2015-06-12 NOTE — Assessment & Plan Note (Signed)
BP relatively well controlled, but with her own acknowledgement, her BP was better controlled on Diovan-HCTZ 320-25 mg than with 12.5 mg HCTZ.  Has decided to drink more & go back to 320-25 mg On stable Bystolic dose - HR would potentially tolerate increasing dose to 20mg  if needed.

## 2015-06-16 ENCOUNTER — Telehealth: Payer: Self-pay | Admitting: Cardiology

## 2015-06-16 DIAGNOSIS — I10 Essential (primary) hypertension: Secondary | ICD-10-CM

## 2015-06-16 DIAGNOSIS — I358 Other nonrheumatic aortic valve disorders: Secondary | ICD-10-CM

## 2015-06-16 MED ORDER — DIOVAN HCT 320-25 MG PO TABS: 1.0000 | ORAL_TABLET | Freq: Every day | ORAL | Status: DC

## 2015-06-16 MED ORDER — DIOVAN HCT 320-25 MG PO TABS
1.0000 | ORAL_TABLET | Freq: Every day | ORAL | Status: DC
Start: 2015-06-16 — End: 2015-10-19

## 2015-06-16 NOTE — Telephone Encounter (Signed)
DAW Refill submitted to patient's preferred pharmacy. Informed patient. Pt voiced understanding, no other stated concerns at this time.  

## 2015-06-16 NOTE — Telephone Encounter (Signed)
Pt called in stating that Dr. Ellyn Hack called in the generic medication for Diovan and she says that the generic does not work for her and would like for him to call in the brand name for her. She said that her insurance has approved for her to receive this. Please f/u with her  Thanks

## 2015-08-24 ENCOUNTER — Other Ambulatory Visit: Payer: Self-pay | Admitting: Cardiology

## 2015-08-24 NOTE — Telephone Encounter (Signed)
Received a call from pharmacy regarding patients Bystolic Refilled as requested by [facility]

## 2015-08-31 DIAGNOSIS — E118 Type 2 diabetes mellitus with unspecified complications: Secondary | ICD-10-CM | POA: Diagnosis not present

## 2015-08-31 DIAGNOSIS — E789 Disorder of lipoprotein metabolism, unspecified: Secondary | ICD-10-CM | POA: Diagnosis not present

## 2015-09-03 DIAGNOSIS — E118 Type 2 diabetes mellitus with unspecified complications: Secondary | ICD-10-CM | POA: Diagnosis not present

## 2015-09-03 DIAGNOSIS — E789 Disorder of lipoprotein metabolism, unspecified: Secondary | ICD-10-CM | POA: Diagnosis not present

## 2015-09-03 DIAGNOSIS — I739 Peripheral vascular disease, unspecified: Secondary | ICD-10-CM | POA: Diagnosis not present

## 2015-10-07 DIAGNOSIS — E118 Type 2 diabetes mellitus with unspecified complications: Secondary | ICD-10-CM | POA: Diagnosis not present

## 2015-10-11 DIAGNOSIS — E789 Disorder of lipoprotein metabolism, unspecified: Secondary | ICD-10-CM | POA: Diagnosis not present

## 2015-10-19 ENCOUNTER — Other Ambulatory Visit: Payer: Self-pay | Admitting: *Deleted

## 2015-10-19 DIAGNOSIS — I10 Essential (primary) hypertension: Secondary | ICD-10-CM

## 2015-10-19 DIAGNOSIS — I358 Other nonrheumatic aortic valve disorders: Secondary | ICD-10-CM

## 2015-10-19 MED ORDER — DIOVAN HCT 320-25 MG PO TABS: 1.0000 | ORAL_TABLET | Freq: Every day | ORAL | Status: DC

## 2015-10-29 ENCOUNTER — Other Ambulatory Visit: Payer: Self-pay | Admitting: Cardiology

## 2015-10-29 DIAGNOSIS — I10 Essential (primary) hypertension: Secondary | ICD-10-CM

## 2015-10-29 DIAGNOSIS — I358 Other nonrheumatic aortic valve disorders: Secondary | ICD-10-CM

## 2015-10-29 NOTE — Telephone Encounter (Signed)
COVER MY MED PRIOR AUTHORIZATION WAS STARTED BY NATHAN RN

## 2015-10-29 NOTE — Telephone Encounter (Signed)
New Message  Pt calling concerning her RX of Diovan that she has been out of for a week- stated tjhat [facility] [place] requires pre-auth for it. Pt requested to speak w/ RN Please call back and discuss.

## 2015-10-29 NOTE — Telephone Encounter (Signed)
Returned call to patient. She is needing prior auth for her name-brand diovan (due to chemical sensitivity when taking the generic form). She has been out of med for a week. i requested pt to see if she can get medication for ~ week supply and resume while we get PA approved. Explained no notification was sent from pharmacy. Pt states they were supposed to have sent PA request 1 week ago.  Pt aware this will take 2-4 business days to process once initiated. Discussed w/ Ivin Booty - unable to locate records of last prior auth submittal. Ivin Booty may be able to pull up on covermymeds.  Routed.

## 2015-11-01 ENCOUNTER — Telehealth: Payer: Self-pay | Admitting: Cardiology

## 2015-11-01 NOTE — Telephone Encounter (Signed)
New message   Dr. Ellyn Hack tried her on generic for 2 months   *STAT* If patient is at the pharmacy, call can be transferred to refill team.   1. Which medications need to be refilled? (please list name of each medication and dose if known) Diovan  320 -25 mg   2. Which pharmacy/location (including street and city if local pharmacy) is medication to be sent to? [facility] mail order     3. Do they need a 30 day or 90 day supply? 90 days supply    Patient is asking for a call back

## 2015-11-01 NOTE — Telephone Encounter (Signed)
MEDICATION WAS DENIED - INFORMATION NOT COMPLETE PATIENT HAD TRIED CANDESARTAN- HCT  AS WELL GENERIC VALSARTAN - HCT  UNABLE TO CONTACT INSURANCE BY PHONE FOR AN APPEAL .  WILL TR Yehuda Savannah

## 2015-11-01 NOTE — Telephone Encounter (Addendum)
See previous documentation. Returned call to patient, prior auth denied. Verified Kathy [name] received this notification. Appeal to be done and submitted.

## 2015-11-02 MED ORDER — VALSARTAN-HYDROCHLOROTHIAZIDE 320-25 MG PO TABS
1.0000 | ORAL_TABLET | Freq: Every day | ORAL | Status: DC
Start: 2015-11-02 — End: 2016-05-19

## 2015-11-02 NOTE — Telephone Encounter (Signed)
°*  STAT* If patient is at the pharmacy, call can be transferred to refill team.   1. Which medications need to be refilled? (please list name of each medication and dose if known) Generic Diovan-please call today,she have to have something until the regular Diovan. The pt is sick,she needs this medicine  2. Which pharmacy/location (including street and city if local pharmacy) is [phone]  3. Do they need a 30 day or 90 day supply? #30-today please

## 2015-11-02 NOTE — Telephone Encounter (Signed)
Operator: PLEASE ADVISE PATIENT THAT REFILL WAS SENT TODAY -or- PASS CALL DIRECTLY TO TRIAGE RN. Thank you.

## 2015-11-02 NOTE — Telephone Encounter (Signed)
Unable to reach pt or leave a message  

## 2015-11-02 NOTE — Telephone Encounter (Signed)
Line rings busy. Attempted x2.  Sent in Rx for generic form so that pt can pick up at local pharmacy.

## 2015-11-02 NOTE — Telephone Encounter (Signed)
Pt called her insurance company and they said resend it. Please be sure to put on there this is medically necessary for her to get her Diovan. Please do this asap.

## 2015-11-02 NOTE — Telephone Encounter (Signed)
Pease call,pt says she have been 2 weeks without her medicine. She is not feeling that well,she can not go another day without it. She needs something,even if it is generic.If she not at home,please call-[phone].

## 2015-11-02 NOTE — Telephone Encounter (Signed)
Pt calling again,she needs her medicine today.Please call, pt says she needs to talk to a nurse today. She is having palpitations,she needs her medicine.

## 2015-11-03 ENCOUNTER — Telehealth: Payer: Self-pay | Admitting: Cardiology

## 2015-11-03 NOTE — Telephone Encounter (Signed)
Spoke to representative - 30 min to locate representative Per representative , Appeal has been stated on 11/01/15  confirmation [id number] Will have to wait until 11/08/15 for an answer

## 2015-11-03 NOTE — Telephone Encounter (Signed)
Called left message to call back with [facility]

## 2015-11-03 NOTE — Telephone Encounter (Signed)
New message   Has questions on medication they are trying to get approve - diovan.  320-25 mg

## 2015-11-04 NOTE — Telephone Encounter (Signed)
CALLED LEFT MESSAGE TO CALL BACK 

## 2015-11-04 NOTE — Telephone Encounter (Signed)
Follow up ° ° ° ° ° °Returning a call to the nurse °

## 2015-11-05 NOTE — Telephone Encounter (Signed)
Follow up    [facility] calling back to speak with nurse regarding  Diovan.

## 2015-11-05 NOTE — Telephone Encounter (Signed)
CALLED LEFT MESSAGE TO CALL BACK  PLEASE CONTACT RN IF RETURN CALL OCCURS MULTIPLE ATTEMPTS OF TALKING TO REPRESENTATIVE

## 2015-11-05 NOTE — Telephone Encounter (Signed)
Call left message to return call

## 2015-11-08 DIAGNOSIS — E118 Type 2 diabetes mellitus with unspecified complications: Secondary | ICD-10-CM | POA: Diagnosis not present

## 2015-11-11 DIAGNOSIS — I10 Essential (primary) hypertension: Secondary | ICD-10-CM | POA: Diagnosis not present

## 2015-11-11 DIAGNOSIS — E118 Type 2 diabetes mellitus with unspecified complications: Secondary | ICD-10-CM | POA: Diagnosis not present

## 2015-11-11 DIAGNOSIS — R899 Unspecified abnormal finding in specimens from other organs, systems and tissues: Secondary | ICD-10-CM | POA: Diagnosis not present

## 2015-12-09 DIAGNOSIS — E113299 Type 2 diabetes mellitus with mild nonproliferative diabetic retinopathy without macular edema, unspecified eye: Secondary | ICD-10-CM | POA: Diagnosis not present

## 2015-12-09 DIAGNOSIS — H2513 Age-related nuclear cataract, bilateral: Secondary | ICD-10-CM | POA: Diagnosis not present

## 2015-12-09 DIAGNOSIS — H0259 Other disorders affecting eyelid function: Secondary | ICD-10-CM | POA: Diagnosis not present

## 2015-12-28 ENCOUNTER — Other Ambulatory Visit: Payer: Self-pay

## 2015-12-30 ENCOUNTER — Ambulatory Visit (INDEPENDENT_AMBULATORY_CARE_PROVIDER_SITE_OTHER): Payer: Medicare Other | Admitting: Cardiology

## 2015-12-30 ENCOUNTER — Encounter: Payer: Self-pay | Admitting: Cardiology

## 2015-12-30 VITALS — BP 178/86 | HR 75 | Ht 64.0 in | Wt 150.2 lb

## 2015-12-30 DIAGNOSIS — R002 Palpitations: Secondary | ICD-10-CM | POA: Diagnosis not present

## 2015-12-30 DIAGNOSIS — I10 Essential (primary) hypertension: Secondary | ICD-10-CM | POA: Diagnosis not present

## 2015-12-30 DIAGNOSIS — E1169 Type 2 diabetes mellitus with other specified complication: Secondary | ICD-10-CM

## 2015-12-30 DIAGNOSIS — I358 Other nonrheumatic aortic valve disorders: Secondary | ICD-10-CM

## 2015-12-30 DIAGNOSIS — E785 Hyperlipidemia, unspecified: Secondary | ICD-10-CM | POA: Diagnosis not present

## 2015-12-30 DIAGNOSIS — I359 Nonrheumatic aortic valve disorder, unspecified: Secondary | ICD-10-CM

## 2015-12-30 NOTE — Progress Notes (Signed)
PCP: Dwan Bolt, MD  Clinic Note: Chief Complaint  Patient presents with  . Follow-up    pt states no Sx.  Kathy [name] Hypertension    Palpitations    HPI: Kathy [name] is a 75 y.o. female with a PMH below who presents today for ~6 month f/u for HTN, palpitations & AoV Sclerosis, dysplipidemia  Kathy [name] was last seen in Sept 2016.  Recent Hospitalizations: n/a Studies Reviewed: n/a  Interval History: Kathy [name] is doing relatively well. She still continues to have lots of "stress in her life that affects things".  She thinks her blood pressure is little high today because she has been stressed. She had some insurance issues over being and begin Diovan as the trade names opposed to generic and noticed palpitations during that issue.  Stressed today - not sure what, just little things (world news etc), 2 grown sons. She does note having palpitations associated with stress, coffee, certain foods.    Otherwise Cardiovascular Review of Symptoms As Follows: No chest pain or shortness of breath with rest or exertion.  No PND, orthopnea or edema. (some dependent edema if sitting for a long time. No lightheadedness, weakness or syncope/near syncope. No TIA/amaurosis fugax symptoms. No claudication.  PCP started her on Janumet -- on a 3 month trial. Feels a little bad just after starting it.  ROS: A comprehensive was performed. Review of Systems  Constitutional: Negative for weight loss and malaise/fatigue.  HENT: Negative for congestion and nosebleeds.   Respiratory: Negative for cough, shortness of breath and wheezing.   Gastrointestinal: Negative for blood in stool and melena.  Genitourinary: Negative for hematuria.  Musculoskeletal: Negative for myalgias, joint pain and falls.  Neurological: Positive for dizziness (On occasion if she gets stressed and feels flustered) and headaches (Not very often). Negative for weakness.  Psychiatric/Behavioral: Negative for  depression and memory loss. The patient is nervous/anxious.      Past Medical History  Diagnosis Date  . HTN (hypertension), benign     Prior History of Presumed Hypertensive Cardiomyopathy with LVH - not confrimed by Echo in 2010 & 2015  . Dyslipidemia   . DM type 2 (diabetes mellitus, type 2) ([place])   . Heart palpitations     No documented Arrhythmia    Past Surgical History  Procedure Laterality Date  . Transthoracic echocardiogram  2010; 09/2013    Normal LV thickness and size. EF 65-75%. Hyperdynamic. Much less notable LVH; mild aortic sclerosis with mild aortic insufficiency. No suggestion of hemodynamically significant stenosis.; b) 2015 - NormalLV size & function, EF 60-65% with Gr 1 DD, mild Ao Sclerosis with mild-mod AI   Prior to Admission medications   Medication Sig Start Date End Date Taking? Authorizing Provider  aspirin EC 81 MG tablet Take 81 mg by mouth as needed.   Yes Historical Provider, MD  BYSTOLIC 10 MG tablet TAKE 1 TABLET EVERY DAY 08/24/15  Yes Leonie Man, MD  Cholecalciferol (VITAMIN D3) 2000 UNITS capsule Take 5,000 Units by mouth daily.    Yes Historical Provider, MD  Coenzyme Q10 (CO Q 10) 100 MG CAPS Take 1 capsule by mouth daily.   Yes Historical Provider, MD  JANUMET 50-1000 MG tablet Take 1 tablet by mouth 2 (two) times daily with a meal.  12/28/15  Yes Historical Provider, MD  Multiple Vitamin (MULTIVITAMIN) tablet Take 1 tablet by mouth daily.   Yes Historical Provider, MD  Omega-3 Fatty Acids (EMULSIFIED OMEGA-3) [id number] MG/15ML LIQD Takes 2  teaspoons by mouth daily.   Yes Historical Provider, MD  valsartan-hydrochlorothiazide (DIOVAN HCT) 320-25 MG tablet Take 1 tablet by mouth daily. 11/02/15  Yes Leonie Man, MD   surgeryBegan to  Allergies  Allergen Reactions  . Corn-Containing Products Anaphylaxis  . Ibuprofen Shortness Of Breath  . Peanuts [Peanut Oil] Anaphylaxis  . Penicillins Shortness Of Breath  . Shellfish Allergy Anaphylaxis    . Soy Allergy Anaphylaxis    Only sometimes   Social History   Social History  . Marital Status: Unknown    Spouse Name: N/A  . Number of Children: N/A  . Years of Education: N/A   Social History Main Topics  . Smoking status: Never Smoker   . Smokeless tobacco: Never Used  . Alcohol Use: No  . Drug Use: No  . Sexual Activity: Not Asked   Other Topics Concern  . None   Social History Narrative   Married, mother of 2.   He does not smoke or drink alcohol.   She tries to exercise, has been not as compliant as she had in the past.         Family History  Problem Relation Age of Onset  . Diabetes Brother   . Arrhythmia Brother   . Diabetes Brother     Wt Readings from Last 3 Encounters:  12/30/15 150 lb 3.2 oz (68.13 kg)  06/10/15 152 lb 9.6 oz (69.219 kg)  12/28/14 150 lb 1.6 oz (68.085 kg)    PHYSICAL EXAM BP 178/86 mmHg  Pulse 75  Ht 5\' 4"  (1.626 m)  Wt 150 lb 3.2 oz (68.13 kg)  BMI 25.77 kg/m2  SpO2 95%  - usually lower @ home.  Was rushing today.  150/76 mmHg on recheck.  General appearance: alert, cooperative, appears stated age, no distress and well nourished, well groomed. HEENT: [place]/AT, EOMI, MMM, anicteric sclera  Neck: no adenopathy, no carotid bruit, no JVD; supple, with full range of motion. There is significant muscle tension along the lesser, mastoid and trapezius muscles. Radiated murmur  Lungs: clear to auscultation bilaterally, normal percussion bilaterally and Nonlabored, good air movement  Heart: normal apical impulse, regular rate and rhythm, S1, S2 normal, no S3 or S4 and 2/6 early peaking crescendo-decrescendo SEM heard at the RUSB  Abdomen: soft, non-tender; bowel sounds normal; no masses, no organomegaly  Extremities: extremities normal, atraumatic, no cyanosis or edema  Pulses: 2+ and symmetric  Neurologic: Grossly normal   Adult ECG Report  Rate: 77 ;  Rhythm: normal sinus rhythm and Nonspecific ST and T-wave changes.  Otherwise normal axis, intervals and durations.;   Narrative Interpretation: Stable EKG   Other studies Reviewed: Additional studies/ records that were reviewed today include:  Recent Labs:  No results found for: CHOL, HDL, LDLCALC, LDLDIRECT, TRIG, CHOLHDL - Followed by PCP   ASSESSMENT / PLAN: Problem List Items Addressed This Visit    Moderate essential hypertension (Chronic)    Blood pressure is actually high today. She is on max dose of Diovan HCTZ (trade name as she had side effects from generic) along with 10 mg Bystolic.  She says at home her blood pressures are much better than this. My recheck was already down to 150/76. We can continue to monitor and I would probably increase Bystolic to 20 mg if her pressures continue to be high. This can be done by PCP as well.      Relevant Orders   EKG 12-Lead (Completed)   Heart palpitations (Chronic)  Pretty much controlled with current dose of Bystolic with no adverse side effects. She is now identified several triggers for her palpitations that she is trying to avoid.      Relevant Orders   EKG 12-Lead (Completed)   Dyslipidemia associated with type 2 diabetes mellitus (HCC) (Chronic)   Relevant Medications   JANUMET 50-1000 MG tablet   Other Relevant Orders   EKG 12-Lead (Completed)   Aortic heart murmur - Primary (Chronic)    No real change in exam. Aortic sclerosis on echo. If exam changes in intensity or delay, would consider rechecking echo.      Relevant Orders   EKG 12-Lead (Completed)     PATIENT INSTRUCTIONS: Current medicines are reviewed at length with the patient today. (+/- concerns) none The following changes have been made: None  Monitor BP @ home & with PCP.   Studies Ordered:   Orders Placed This Encounter  Procedures  . EKG 12-Lead   F/U 6 MONTHS - per pt. Request.   Leonie Man, M.D., M.S. Interventional Cardiologist   Pager # [phone] Phone # [phone] [address]  [place], [place] [address]

## 2015-12-30 NOTE — Patient Instructions (Signed)
NO CHANGE IN CURRENT MEDICATIONS   Your physician wants you to follow-up in 6 MONTHS WITH DR HARDING. -30 MIN  You will receive a reminder letter in the mail two months in advance. If you don't receive a letter, please call our office to schedule the follow-up appointment.  If you need a refill on your cardiac medications before your next appointment, please call your pharmacy.   

## 2016-01-01 ENCOUNTER — Encounter: Payer: Self-pay | Admitting: Cardiology

## 2016-01-01 NOTE — Assessment & Plan Note (Signed)
Blood pressure is actually high today. She is on max dose of Diovan HCTZ (trade name as she had side effects from generic) along with 10 mg Bystolic.  She says at home her blood pressures are much better than this. My recheck was already down to 150/76. We can continue to monitor and I would probably increase Bystolic to 20 mg if her pressures continue to be high. This can be done by PCP as well.

## 2016-01-01 NOTE — Assessment & Plan Note (Signed)
No real change in exam. Aortic sclerosis on echo. If exam changes in intensity or delay, would consider rechecking echo.

## 2016-01-01 NOTE — Assessment & Plan Note (Signed)
Pretty much controlled with current dose of Bystolic with no adverse side effects. She is now identified several triggers for her palpitations that she is trying to avoid.

## 2016-01-12 DIAGNOSIS — H01002 Unspecified blepharitis right lower eyelid: Secondary | ICD-10-CM | POA: Diagnosis not present

## 2016-01-12 DIAGNOSIS — H01024 Squamous blepharitis left upper eyelid: Secondary | ICD-10-CM | POA: Diagnosis not present

## 2016-01-12 DIAGNOSIS — H16221 Keratoconjunctivitis sicca, not specified as Sjogren's, right eye: Secondary | ICD-10-CM | POA: Diagnosis not present

## 2016-01-12 DIAGNOSIS — H01004 Unspecified blepharitis left upper eyelid: Secondary | ICD-10-CM | POA: Diagnosis not present

## 2016-01-12 DIAGNOSIS — H169 Unspecified keratitis: Secondary | ICD-10-CM | POA: Diagnosis not present

## 2016-01-12 DIAGNOSIS — Z880 Allergy status to penicillin: Secondary | ICD-10-CM | POA: Diagnosis not present

## 2016-01-12 DIAGNOSIS — H01022 Squamous blepharitis right lower eyelid: Secondary | ICD-10-CM | POA: Diagnosis not present

## 2016-01-12 DIAGNOSIS — H01021 Squamous blepharitis right upper eyelid: Secondary | ICD-10-CM | POA: Diagnosis not present

## 2016-01-12 DIAGNOSIS — H35361 Drusen (degenerative) of macula, right eye: Secondary | ICD-10-CM | POA: Diagnosis not present

## 2016-01-12 DIAGNOSIS — H01001 Unspecified blepharitis right upper eyelid: Secondary | ICD-10-CM | POA: Diagnosis not present

## 2016-01-12 DIAGNOSIS — H01005 Unspecified blepharitis left lower eyelid: Secondary | ICD-10-CM | POA: Diagnosis not present

## 2016-01-12 DIAGNOSIS — Z886 Allergy status to analgesic agent status: Secondary | ICD-10-CM | POA: Diagnosis not present

## 2016-01-12 DIAGNOSIS — G245 Blepharospasm: Secondary | ICD-10-CM | POA: Diagnosis not present

## 2016-01-12 DIAGNOSIS — H2513 Age-related nuclear cataract, bilateral: Secondary | ICD-10-CM | POA: Diagnosis not present

## 2016-02-01 DIAGNOSIS — E789 Disorder of lipoprotein metabolism, unspecified: Secondary | ICD-10-CM | POA: Diagnosis not present

## 2016-02-01 DIAGNOSIS — E118 Type 2 diabetes mellitus with unspecified complications: Secondary | ICD-10-CM | POA: Diagnosis not present

## 2016-02-09 ENCOUNTER — Other Ambulatory Visit: Payer: Self-pay | Admitting: Cardiology

## 2016-02-09 DIAGNOSIS — I359 Nonrheumatic aortic valve disorder, unspecified: Secondary | ICD-10-CM | POA: Diagnosis not present

## 2016-02-09 DIAGNOSIS — E789 Disorder of lipoprotein metabolism, unspecified: Secondary | ICD-10-CM | POA: Diagnosis not present

## 2016-02-09 DIAGNOSIS — E118 Type 2 diabetes mellitus with unspecified complications: Secondary | ICD-10-CM | POA: Diagnosis not present

## 2016-02-10 NOTE — Telephone Encounter (Signed)
Rx(s) sent to pharmacy electronically.  

## 2016-02-24 DIAGNOSIS — Z7982 Long term (current) use of aspirin: Secondary | ICD-10-CM | POA: Diagnosis not present

## 2016-02-24 DIAGNOSIS — H01022 Squamous blepharitis right lower eyelid: Secondary | ICD-10-CM | POA: Diagnosis not present

## 2016-02-24 DIAGNOSIS — H01021 Squamous blepharitis right upper eyelid: Secondary | ICD-10-CM | POA: Diagnosis not present

## 2016-02-24 DIAGNOSIS — Z886 Allergy status to analgesic agent status: Secondary | ICD-10-CM | POA: Diagnosis not present

## 2016-02-24 DIAGNOSIS — H01024 Squamous blepharitis left upper eyelid: Secondary | ICD-10-CM | POA: Diagnosis not present

## 2016-02-24 DIAGNOSIS — G245 Blepharospasm: Secondary | ICD-10-CM | POA: Diagnosis not present

## 2016-02-24 DIAGNOSIS — Z7984 Long term (current) use of oral hypoglycemic drugs: Secondary | ICD-10-CM | POA: Diagnosis not present

## 2016-02-24 DIAGNOSIS — Z880 Allergy status to penicillin: Secondary | ICD-10-CM | POA: Diagnosis not present

## 2016-02-24 DIAGNOSIS — E119 Type 2 diabetes mellitus without complications: Secondary | ICD-10-CM | POA: Diagnosis not present

## 2016-02-24 DIAGNOSIS — H2513 Age-related nuclear cataract, bilateral: Secondary | ICD-10-CM | POA: Diagnosis not present

## 2016-02-24 DIAGNOSIS — Z79899 Other long term (current) drug therapy: Secondary | ICD-10-CM | POA: Diagnosis not present

## 2016-02-24 DIAGNOSIS — H01025 Squamous blepharitis left lower eyelid: Secondary | ICD-10-CM | POA: Diagnosis not present

## 2016-03-06 ENCOUNTER — Other Ambulatory Visit: Payer: Self-pay | Admitting: Endocrinology

## 2016-03-06 DIAGNOSIS — Z1231 Encounter for screening mammogram for malignant neoplasm of breast: Secondary | ICD-10-CM

## 2016-04-24 ENCOUNTER — Ambulatory Visit
Admission: RE | Admit: 2016-04-24 | Discharge: 2016-04-24 | Disposition: A | Discharge status: Home / Self Care | Payer: Medicare Other | Source: Ambulatory Visit | Attending: Endocrinology | Admitting: Endocrinology

## 2016-04-24 DIAGNOSIS — Z1231 Encounter for screening mammogram for malignant neoplasm of breast: Secondary | ICD-10-CM

## 2016-05-15 ENCOUNTER — Telehealth: Payer: Self-pay | Admitting: Cardiology

## 2016-05-15 NOTE — Telephone Encounter (Signed)
Patient states she is having problems with her Diovan.  She has experienced hair loss and elevation in her blood sugars.  She has an appointment on Friday to see Dr. Ellyn Hack, but also wanted to speak with the nurse.

## 2016-05-15 NOTE — Telephone Encounter (Signed)
Returned patient call-pt reports increase in hair loss and increase in blood sugars ~50 points.  Pt reports she thinks this is due to her Diovan or Bystolic and would like to be placed on something else.  Pt has appt Friday 9/1 with MD Ellyn Hack to discuss medications as well.    Will route to MD and pharmacist.  Pt verbalized understanding.

## 2016-05-16 NOTE — Telephone Encounter (Signed)
Both of these medications have the potential to cause alopecia and beta blockers can cause an elevation in blood sugar.  She has been on both for > 1 year, so I don't think they would be the cause.  Most of the problems we see with beta blockers are when doses are increased.   Have her review with Dr. Ellyn Hack at her appointment.

## 2016-05-17 NOTE — Telephone Encounter (Signed)
Returned call to patient-made aware to pharmacist recommendations and to further discuss with MD Ellyn Hack at [facility] on Friday 9/1.   Pt verbalized understanding.

## 2016-05-19 ENCOUNTER — Encounter (INDEPENDENT_AMBULATORY_CARE_PROVIDER_SITE_OTHER): Payer: Self-pay

## 2016-05-19 ENCOUNTER — Ambulatory Visit (INDEPENDENT_AMBULATORY_CARE_PROVIDER_SITE_OTHER): Payer: Medicare Other | Admitting: Cardiology

## 2016-05-19 ENCOUNTER — Encounter: Payer: Self-pay | Admitting: Cardiology

## 2016-05-19 VITALS — BP 122/76 | HR 80 | Ht 63.5 in | Wt 145.2 lb

## 2016-05-19 DIAGNOSIS — I10 Essential (primary) hypertension: Secondary | ICD-10-CM | POA: Diagnosis not present

## 2016-05-19 DIAGNOSIS — R002 Palpitations: Secondary | ICD-10-CM

## 2016-05-19 DIAGNOSIS — I359 Nonrheumatic aortic valve disorder, unspecified: Secondary | ICD-10-CM

## 2016-05-19 DIAGNOSIS — R739 Hyperglycemia, unspecified: Secondary | ICD-10-CM | POA: Diagnosis not present

## 2016-05-19 DIAGNOSIS — I358 Other nonrheumatic aortic valve disorders: Secondary | ICD-10-CM

## 2016-05-19 MED ORDER — HYDROCHLOROTHIAZIDE 25 MG PO TABS: 25.0000 mg | ORAL_TABLET | Freq: Every day | ORAL | 11 refills | Status: DC

## 2016-05-19 MED ORDER — AMLODIPINE BESYLATE 5 MG PO TABS: 5.0000 mg | ORAL_TABLET | Freq: Every day | ORAL | 11 refills | Status: DC

## 2016-05-19 NOTE — Patient Instructions (Signed)
STOP DIOVAN- HCT  START  HYDROCHLOROTHIAZIDE 25 MG ONE TABLET DAILY FOR ONE FULL WEEK THEN ADD AMLODIPINE 5 MG ONE TABLET BY PUTH DAILY.   Your physician recommends that you schedule a follow-up appointment in: 2 [place]    Your physician wants you to follow-up in: [place].- 30 MIN You will receive a reminder letter in the mail two months in advance. If you don't receive a letter, please call our office to schedule the follow-up appointment.   If you need a refill on your cardiac medications before your next appointment, please call your pharmacy.

## 2016-05-19 NOTE — Progress Notes (Signed)
PCP: Dwan Bolt, MD  Clinic Note: Chief Complaint  Patient presents with  . Follow-up    medication concerns  . Hypertension    HPI: Kathy [name] is a 75 y.o. female with a PMH below who presents today for ~6 month f/u for HTN, palpitations & AoV Sclerosis, dysplipidemia.  She has been difficult to manage simply because of potential side effects of medications.  Kathy [name] was last seen in Sept 2016.  Recent Hospitalizations: n/a Studies Reviewed: n/a  Interval History: Kathy [name] is doing relatively well. Her major concerns today are related to medications. She states that she is noting alopecia that is getting worse over the last year and thinks it is related to her ARB. She states that she's done a lot of research on this and can only think that it has been related to the [place]. She also thinks it may be related to Bystolic but, but is more concerned the Bystolic is causing her to have increased levels of blood sugar making her diabetes heart control.   Otherwise Cardiovascular Review of Symptoms As Follows: No chest pain or shortness of breath with rest or exertion.  No PND, orthopnea or edema. (some dependent edema if sitting for a long time. No lightheadedness, weakness or syncope/near syncope. No TIA/amaurosis fugax symptoms. No claudication.  PCP started her on Jardiance-- on a 3 month trial. Now with cramping etc.    ROS: A comprehensive was performed. Review of Systems  Constitutional: Negative for malaise/fatigue and weight loss.  HENT: Negative for congestion and nosebleeds.   Respiratory: Negative for cough, shortness of breath and wheezing.   Gastrointestinal: Negative for blood in stool and melena.  Genitourinary: Negative for hematuria.  Musculoskeletal: Negative for falls, joint pain and myalgias.  Skin: Negative.        Most notable for alopecia  Neurological: Positive for dizziness (On occasion if she gets stressed and feels flustered)  and headaches (Not very often). Negative for weakness.  Psychiatric/Behavioral: Negative for depression and memory loss. The patient is nervous/anxious.     Past Medical History:  Diagnosis Date  . DM type 2 (diabetes mellitus, type 2) ([place])   . Dyslipidemia   . Heart palpitations    No documented Arrhythmia  . HTN (hypertension), benign    Prior History of Presumed Hypertensive Cardiomyopathy with LVH - not confrimed by Echo in 2010 & 2015    Past Surgical History:  Procedure Laterality Date  . TRANSTHORACIC ECHOCARDIOGRAM  2010; 09/2013   Normal LV thickness and size. EF 65-75%. Hyperdynamic. Much less notable LVH; mild aortic sclerosis with mild aortic insufficiency. No suggestion of hemodynamically significant stenosis.; b) 2015 - NormalLV size & function, EF 60-65% with Gr 1 DD, mild Ao Sclerosis with mild-mod AI    Prior to Admission medications   Medication Sig Start Date End Date Taking? Authorizing Provider  aspirin EC 81 MG tablet Take 81 mg by mouth as needed.   Yes Historical Provider, MD  BYSTOLIC 10 MG tablet TAKE 1 TABLET EVERY DAY 08/24/15  Yes Leonie Man, MD  Cholecalciferol (VITAMIN D3) 2000 UNITS capsule Take 5,000 Units by mouth daily.    Yes Historical Provider, MD  Coenzyme Q10 (CO Q 10) 100 MG CAPS Take 1 capsule by mouth daily.   Yes Historical Provider, MD  JANUMET 50-1000 MG tablet Take 1 tablet by mouth 2 (two) times daily with a meal.  12/28/15  Yes Historical Provider, MD  Multiple Vitamin (MULTIVITAMIN) tablet Take  1 tablet by mouth daily.   Yes Historical Provider, MD  Omega-3 Fatty Acids (EMULSIFIED OMEGA-3) [id number] MG/15ML LIQD Takes 2 teaspoons by mouth daily.   Yes Historical Provider, MD  valsartan-hydrochlorothiazide (DIOVAN HCT) 320-25 MG tablet Take 1 tablet by mouth daily. 11/02/15  Yes Leonie Man, MD    Allergies  Allergen Reactions  . Corn-Containing Products Anaphylaxis  . Ibuprofen Shortness Of Breath  . Peanuts [Peanut Oil]  Anaphylaxis  . Penicillins Shortness Of Breath  . Shellfish Allergy Anaphylaxis  . Soy Allergy Anaphylaxis    Only sometimes   Social History   Social History  . Marital status: Unknown    Spouse name: N/A  . Number of children: N/A  . Years of education: N/A   Social History Main Topics  . Smoking status: Never Smoker  . Smokeless tobacco: Never Used  . Alcohol use No  . Drug use: No  . Sexual activity: Not Asked   Other Topics Concern  . None   Social History Narrative   Married, mother of 2.   He does not smoke or drink alcohol.   She tries to exercise, has been not as compliant as she had in the past.         Family History  Problem Relation Age of Onset  . Diabetes Brother   . Arrhythmia Brother   . Diabetes Brother     Wt Readings from Last 3 Encounters:  05/19/16 145 lb 3.2 oz (65.9 kg)  12/30/15 150 lb 3.2 oz (68.1 kg)  06/10/15 152 lb 9.6 oz (69.2 kg)    PHYSICAL EXAM BP 122/76   Pulse 80   Ht 5' 3.5" (1.613 m)   Wt 145 lb 3.2 oz (65.9 kg)   BMI 25.32 kg/m   General appearance: alert, cooperative, appears stated age, no distress and well nourished, well groomed. HEENT: [place]/AT, EOMI, MMM, anicteric sclera  Neck: no adenopathy, no carotid bruit, no JVD; supple, with full range of motion. There is significant muscle tension along the lesser, mastoid and trapezius muscles. Radiated murmur  Lungs: clear to auscultation bilaterally, normal percussion bilaterally and Nonlabored, good air movement  Heart: normal apical impulse, regular rate and rhythm, S1, S2 normal, no S3 or S4 and 2/6 early peaking crescendo-decrescendo SEM heard at the RUSB  Abdomen: soft, non-tender; bowel sounds normal; no masses, no organomegaly  Extremities: extremities normal, atraumatic, no cyanosis or edema  Pulses: 2+ and symmetric  Neurologic: Grossly normal   Adult ECG Report  Rate: 80;  Rhythm: normal sinus rhythm . Otherwise normal axis, intervals and durations.;    Narrative Interpretation: Stable EKG   Other studies Reviewed: Additional studies/ records that were reviewed today include:  Recent Labs:  No results found for: CHOL, HDL, LDLCALC, LDLDIRECT, TRIG, CHOLHDL - Followed by PCP   ASSESSMENT / PLAN: Problem List Items Addressed This Visit    Moderate essential hypertension - Primary (Chronic)    Blood pressure looks good today. I really hate stopping anything, but based on the fact that she is having concerns about Diovan, I think the best thing to do is simply stop the Diovan. Plan: DC Diovan-HCTZ.  Start HCTZ 25 mg daily.  After one week start amlodipine 5 mg daily.  Follow-up with Pharmacist Team CVVR fo rBP check in 2 months.  I would really hate to switch from Bystolic to a different beta blocker, simply because we finally have good control of her pressures. She can discuss this with the pharmacy  team      Relevant Medications   amLODipine (NORVASC) 5 MG tablet   hydrochlorothiazide (HYDRODIURIL) 25 MG tablet   Other Relevant Orders   EKG 12-Lead   Hyperglycemia   Relevant Orders   EKG 12-Lead   Heart palpitations (Chronic)    Finally controlled with beta blocker. He had another reason for [place] to not try to stop the beta blocker.      Relevant Orders   EKG 12-Lead   Aortic heart murmur (Chronic)   Relevant Orders   EKG 12-Lead    Other Visit Diagnoses   None.    PATIENT INSTRUCTIONS: Current medicines are reviewed at length with the patient today. (+/- concerns) none The following changes have been made: None STOP DIOVAN- HCT  START  HYDROCHLOROTHIAZIDE 25 MG ONE TABLET DAILY FOR ONE FULL WEEK THEN ADD AMLODIPINE 5 MG ONE TABLET BY PUTH DAILY.   Your physician recommends that you schedule a follow-up appointment in: 2 [place]    Your physician wants you to follow-up in: [place].- 30 MIN- per pt. Request.  Studies Ordered:   Orders Placed This Encounter    Procedures  . EKG 12-Lead   Close to 30 minutes spent in with the patient today. Greater than 50% of which was spent in direct counseling.    Glenetta Hew, M.D., M.S. Interventional Cardiologist   Pager # [phone] Phone # [phone] [address]. [place], [place] [address]

## 2016-05-22 ENCOUNTER — Encounter: Payer: Self-pay | Admitting: Cardiology

## 2016-05-22 NOTE — Assessment & Plan Note (Signed)
Finally controlled with beta blocker. He had another reason for [place] to not try to stop the beta blocker.

## 2016-05-22 NOTE — Assessment & Plan Note (Addendum)
Blood pressure looks good today. I really hate stopping anything, but based on the fact that she is having concerns about Diovan, I think the best thing to do is simply stop the Diovan. Plan: DC Diovan-HCTZ.  Start HCTZ 25 mg daily.  After one week start amlodipine 5 mg daily.  Follow-up with Pharmacist Team CVVR fo rBP check in 2 months.  I would really hate to switch from Bystolic to a different beta blocker, simply because we finally have good control of her pressures. She can discuss this with the pharmacy team

## 2016-07-24 ENCOUNTER — Ambulatory Visit: Payer: Medicare Other

## 2016-08-04 ENCOUNTER — Other Ambulatory Visit: Payer: Self-pay | Admitting: Cardiology

## 2016-08-04 DIAGNOSIS — I10 Essential (primary) hypertension: Secondary | ICD-10-CM

## 2016-08-04 DIAGNOSIS — I358 Other nonrheumatic aortic valve disorders: Secondary | ICD-10-CM

## 2016-08-17 ENCOUNTER — Ambulatory Visit: Payer: Medicare Other

## 2016-09-13 NOTE — Progress Notes (Signed)
Patient ID: Kathy [name]                 DOB: [birth year]                      MRN: [id number]     HPI: Kathy [name] is a 75 y.o. female patient of Dr. Ellyn Hack with [place] below who presents today for hypertension evaluation. She saw Dr. Ellyn Hack 2 months ago and her diovan HCT was discontinued and she was started on HCTZ and amlodipine 5mg .   She presents today stating she is feeling well. She does have some blurry vision, but she believes that this is secondary to muscle spasms in her eyes for which she has gotten botox injections about 1 week ago. She states this has slowly improved.    Cardiac Hx: HTN, HLD, Palpitations, DM  Current HTN meds:  Hydrochlorothiazide 25mg  daily bystolic 10mg  daily Amlodipine 5mg  daily QHS  Previously tried:  Valsartan - hair loss Candesartan  BP goal: <130/80  Family History: No known  Social History: Denies tobacco and alcohol.   Diet: She states she eats a mostly fresh diet. She does use some salt while preparing meals, but none at the table. She drinks 1 cup of coffee per day. She requests a consult with dietitian today.   Exercise: She wants to improve her work out routine. She has been doing standing exercises.   Home BP readings: She has an arm cuff and states her measurements have been 110s/70s-80s.   Wt Readings from Last 3 Encounters:  05/19/16 145 lb 3.2 oz (65.9 kg)  12/30/15 150 lb 3.2 oz (68.1 kg)  06/10/15 152 lb 9.6 oz (69.2 kg)   BP Readings from Last 3 Encounters:  09/15/16 (!) 142/70  05/19/16 122/76  12/30/15 (!) 178/86   Pulse Readings from Last 3 Encounters:  09/15/16 70  05/19/16 80  12/30/15 75    Renal function: CrCl cannot be calculated (No order found.).  Past Medical History:  Diagnosis Date  . DM type 2 (diabetes mellitus, type 2) ([place])   . Dyslipidemia   . Heart palpitations    No documented Arrhythmia  . HTN (hypertension), benign    Prior History of Presumed Hypertensive Cardiomyopathy with  LVH - not confrimed by Echo in 2010 & 2015    Current Outpatient Prescriptions on File Prior to Visit  Medication Sig Dispense Refill  . aspirin EC 81 MG tablet Take 81 mg by mouth as needed.    Marland Kitchen BYSTOLIC 10 MG tablet TAKE 1 TABLET EVERY DAY 90 tablet 3  . Cholecalciferol (VITAMIN D3) 2000 UNITS capsule Take 5,000 Units by mouth daily.     . Coenzyme Q10 (CO Q 10) 100 MG CAPS Take 300 mg by mouth daily.     . hydrochlorothiazide (HYDRODIURIL) 25 MG tablet Take 1 tablet (25 mg total) by mouth daily. 30 tablet 11  . JARDIANCE 25 MG TABS tablet Take 12.5 mg by mouth 2 (two) times daily.   1  . metFORMIN (GLUCOPHAGE) 500 MG tablet Take 1,000 mg by mouth 2 (two) times daily.    . Multiple Vitamin (MULTIVITAMIN) tablet Take 1 tablet by mouth daily.    . Omega-3 Fatty Acids (EMULSIFIED OMEGA-3) [id number] MG/15ML LIQD Takes 2 teaspoons by mouth daily.    Marland Kitchen amLODipine (NORVASC) 5 MG tablet Take 1 tablet (5 mg total) by mouth daily. 30 tablet 11   No current facility-administered medications on file prior to visit.  Allergies  Allergen Reactions  . Corn-Containing Products Anaphylaxis  . Ibuprofen Shortness Of Breath  . Peanuts [Peanut Oil] Anaphylaxis  . Penicillins Shortness Of Breath  . Shellfish Allergy Anaphylaxis  . Soy Allergy Anaphylaxis    Only sometimes    Blood pressure (!) 142/70, pulse 70, SpO2 98 %.   Assessment/Plan: Hypertension: BP not at goal. She believes that she has a touch of white coat hypertension since home pressures have been controlled. No medication changes today. Continue to monitor, bring log and meter to follow up in 4 weeks.    Thank you, Lelan Pons. Patterson Hammersmith, [facility]  09/19/2016 8:20 AM

## 2016-09-15 ENCOUNTER — Ambulatory Visit (INDEPENDENT_AMBULATORY_CARE_PROVIDER_SITE_OTHER): Payer: Medicare Other | Admitting: Pharmacist

## 2016-09-15 VITALS — BP 142/70 | HR 70

## 2016-09-15 DIAGNOSIS — I10 Essential (primary) hypertension: Secondary | ICD-10-CM

## 2016-09-15 NOTE — Patient Instructions (Signed)
Return for a follow up appointment in 1 month  Check your blood pressure at home daily (if able) and keep record of the readings.  Take your BP meds as follows: Continue as prescribed  Bring all of your meds, your BP cuff and your record of home blood pressures to your next appointment.  Exercise as you're able, try to walk approximately 30 minutes per day.  Keep salt intake to a minimum, especially watch canned and prepared boxed foods.  Eat more fresh fruits and vegetables and fewer canned items.  Avoid eating in fast food restaurants.    HOW TO TAKE YOUR BLOOD PRESSURE: . Rest 5 minutes before taking your blood pressure. .  Don't smoke or drink caffeinated beverages for at least 30 minutes before. . Take your blood pressure before (not after) you eat. . Sit comfortably with your back supported and both feet on the floor (don't cross your legs). . Elevate your arm to heart level on a table or a desk. . Use the proper sized cuff. It should fit smoothly and snugly around your bare upper arm. There should be enough room to slip a fingertip under the cuff. The bottom edge of the cuff should be 1 inch above the crease of the elbow. . Ideally, take 3 measurements at one sitting and record the average.

## 2016-09-19 ENCOUNTER — Encounter: Payer: Self-pay | Admitting: Pharmacist

## 2016-10-13 ENCOUNTER — Ambulatory Visit: Payer: Medicare Other

## 2016-10-31 ENCOUNTER — Ambulatory Visit (INDEPENDENT_AMBULATORY_CARE_PROVIDER_SITE_OTHER): Payer: Medicare Other | Admitting: Pharmacist

## 2016-10-31 VITALS — BP 142/72 | HR 87

## 2016-10-31 DIAGNOSIS — I10 Essential (primary) hypertension: Secondary | ICD-10-CM | POA: Diagnosis not present

## 2016-10-31 NOTE — Patient Instructions (Addendum)
Return for a  follow up appointment in 3 weeks with Dr Ellyn Hack and 6 weeks with hypertension clinic  Your blood pressure today is 142/72 with pulse 87  Check your blood pressure at home daily (if able) and keep record of the readings.  Take your BP meds as follows: Hydrochlorothiazide 25mg  daily Bystolic 10mg  daily Amlodipine 5mg  daily QHS  **Resume hydroxyzine per primary care**  Bring all of your meds, your BP cuff and your record of home blood pressures to your next appointment.  Exercise as you're able, try to walk approximately 30 minutes per day.  Keep salt intake to a minimum, especially watch canned and prepared boxed foods.  Eat more fresh fruits and vegetables and fewer canned items.  Avoid eating in fast food restaurants.    HOW TO TAKE YOUR BLOOD PRESSURE: . Rest 5 minutes before taking your blood pressure. .  Don't smoke or drink caffeinated beverages for at least 30 minutes before. . Take your blood pressure before (not after) you eat. . Sit comfortably with your back supported and both feet on the floor (don't cross your legs). . Elevate your arm to heart level on a table or a desk. . Use the proper sized cuff. It should fit smoothly and snugly around your bare upper arm. There should be enough room to slip a fingertip under the cuff. The bottom edge of the cuff should be 1 inch above the crease of the elbow. . Ideally, take 3 measurements at one sitting and record the average.

## 2016-10-31 NOTE — Progress Notes (Signed)
Patient ID: Kathy [name]                 DOB: 18-Jan-1941                      MRN: [id number]     HPI:  Kathy [name] is a 76 y.o. female patient of Dr. Ellyn Hack with PMH relevant for HTN, HLD, uncontrolled DM and palpitations who presents today for hypertension follow up. Dr. Ellyn Hack discontinued Diovan HCT on 05/2016 due to hair loss ADR. Patient was started on HCTZ plus amlodipine 5mg .  No changes were made during last OV in December/29/17. Patient denies headaches, dizziness, swelling or fatigue.  Reports a recent flare of gout about 2 weeks ago treated by holding HCTZ and taking prednisone. HCTZ 25mg  daily therapy was resumed about 1 weeks ago.  Kathy [name] also reports a lot of anxiety on a regular bases.  Current HTN meds:  Hydrochlorothiazide 25mg  daily (help for 1 weeks while on prednisone per MDs instructions) bystolic 10mg  daily Amlodipine 5mg  daily QHS  Previously tried:  Valsartan - hair loss Candesartan  BP goal: <130/80  Family History: No known  Social History: Denies tobacco and alcohol.   Diet: She states she eats a mostly fresh diet. She does use some salt while preparing meals, but none at the table. She drinks 1 cup of coffee per day. She requests a consult with dietitian today.   Exercise: She wants to improve her work out routine. She has been doing standing exercises.   Home BP readings: ~130/70s reported by patient; no records provided  Wt Readings from Last 3 Encounters:  05/19/16 145 lb 3.2 oz (65.9 kg)  12/30/15 150 lb 3.2 oz (68.1 kg)  06/10/15 152 lb 9.6 oz (69.2 kg)   BP Readings from Last 3 Encounters:  10/31/16 (!) 142/72  09/15/16 (!) 142/70  05/19/16 122/76   Pulse Readings from Last 3 Encounters:  10/31/16 87  09/15/16 70  05/19/16 80    Past Medical History:  Diagnosis Date  . DM type 2 (diabetes mellitus, type 2) ([place])   . Dyslipidemia   . Heart palpitations    No documented Arrhythmia  . HTN (hypertension), benign      Prior History of Presumed Hypertensive Cardiomyopathy with LVH - not confrimed by Echo in 2010 & 2015    Current Outpatient Prescriptions on File Prior to Visit  Medication Sig Dispense Refill  . amLODipine (NORVASC) 5 MG tablet Take 1 tablet (5 mg total) by mouth daily. 30 tablet 11  . aspirin EC 81 MG tablet Take 81 mg by mouth as needed.    Marland Kitchen BYSTOLIC 10 MG tablet TAKE 1 TABLET EVERY DAY 90 tablet 3  . Cholecalciferol (VITAMIN D3) 2000 UNITS capsule Take 5,000 Units by mouth daily.     . Coenzyme Q10 (CO Q 10) 100 MG CAPS Take 300 mg by mouth daily.     . hydrochlorothiazide (HYDRODIURIL) 25 MG tablet Take 1 tablet (25 mg total) by mouth daily. 30 tablet 11  . JARDIANCE 25 MG TABS tablet Take 12.5 mg by mouth 2 (two) times daily.   1  . metFORMIN (GLUCOPHAGE) 500 MG tablet Take 1,000 mg by mouth 2 (two) times daily.    . Multiple Vitamin (MULTIVITAMIN) tablet Take 1 tablet by mouth daily.    . Omega-3 Fatty Acids (EMULSIFIED OMEGA-3) [id number] MG/15ML LIQD Takes 2 teaspoons by mouth daily.     No current facility-administered medications  on file prior to visit.     Allergies  Allergen Reactions  . Corn-Containing Products Anaphylaxis  . Ibuprofen Shortness Of Breath  . Peanuts [Peanut Oil] Anaphylaxis  . Penicillins Shortness Of Breath  . Shellfish Allergy Anaphylaxis  . Soy Allergy Anaphylaxis    Only sometimes    Blood pressure (!) 142/72, pulse 87, SpO2 98 %.  Essential hypertension:  BP remains elevated today during clinic visit.  Recommendation to increase amlodipine to 7.5mg  daily discussed with patient but patient requesting to defer amlodipine dose change for a later visit. She is to resume hydroxyzine therapy for anxiety management by PCP, then will re-assess BP readings and willing to try amlodipine 7.5mg  if BP remains above goal.  Will continue current therapy without changes for now, asked patient to monitor BP at home and bring records to next OV. Noted Dr Ellyn Hack  will see patient in 3 weeks. Follow up with hypertension clinic in 6 weeks if needed.  Kathy [name] PharmD, [place] [facility] [phone] 10/31/2016 10:36 AM

## 2016-11-17 ENCOUNTER — Encounter: Payer: Self-pay | Admitting: Cardiology

## 2016-11-17 ENCOUNTER — Ambulatory Visit (INDEPENDENT_AMBULATORY_CARE_PROVIDER_SITE_OTHER): Payer: Medicare Other | Admitting: Cardiology

## 2016-11-17 VITALS — BP 142/66 | HR 80 | Ht 63.0 in | Wt 140.0 lb

## 2016-11-17 DIAGNOSIS — E1169 Type 2 diabetes mellitus with other specified complication: Secondary | ICD-10-CM | POA: Diagnosis not present

## 2016-11-17 DIAGNOSIS — I358 Other nonrheumatic aortic valve disorders: Secondary | ICD-10-CM | POA: Diagnosis not present

## 2016-11-17 DIAGNOSIS — R002 Palpitations: Secondary | ICD-10-CM | POA: Diagnosis not present

## 2016-11-17 DIAGNOSIS — E785 Hyperlipidemia, unspecified: Secondary | ICD-10-CM

## 2016-11-17 DIAGNOSIS — I10 Essential (primary) hypertension: Secondary | ICD-10-CM | POA: Diagnosis not present

## 2016-11-17 MED ORDER — HYDROCHLOROTHIAZIDE 12.5 MG PO CAPS: 12.5000 mg | ORAL_CAPSULE | ORAL | 3 refills | Status: DC

## 2016-11-17 MED ORDER — AMLODIPINE BESYLATE 10 MG PO TABS: 10.0000 mg | ORAL_TABLET | Freq: Every day | ORAL | 3 refills | Status: DC

## 2016-11-17 NOTE — Patient Instructions (Addendum)
Medication change to: Hold for three days and then start Hydrochlorothiazide 12.5 mg every other day.   Amlodipine increased to 10mg  daily   Schedule at [address] in July/August 2018 Your physician has requested that you have an echocardiogram. Echocardiography is a painless test that uses sound waves to create images of your heart. It provides your doctor with information about the size and shape of your heart and how well your heart's chambers and valves are working. This procedure takes approximately one hour. There are no restrictions for this procedure.  Your physician wants you to follow-up in: 6 months with Dr. Ellyn Hack. You will receive a reminder letter in the mail two months in advance. If you don't receive a letter, please call our office to schedule the follow-up appointment.

## 2016-11-17 NOTE — Progress Notes (Signed)
PCP: Dwan Bolt, MD  Clinic Note: Chief Complaint  Patient presents with  . Follow-up    6 MONTHS  . Hypertension  . Cardiac Valve Problem    HPI: Kathy [name] is a 76 y.o. female with a PMH below who presents today for ~6 month f/u for HTN, palpitations & AoV Sclerosis, dysplipidemia.  She has been difficult to manage simply because of potential side effects of medications.  Kathy [name] was last seen in Sept 2017. She was noted to have more prominent hypertension summary therefore added hydralazine and amlodipine. She followed up with [facility] pharmacists clinic. Amlodipine was not started. - We had decided to stop Diovan & start HCTZ.  Recent Hospitalizations: n/a Studies Reviewed: n/a  Interval History: Kathy [name] is doing relatively well. She still has issues with her blood pressure, thinks it has been doing better now. The biggest issues that she says she's been having problems with her left foot swelling and pain that could potentially be related to gout. She thinks that is related to her HCTZ. Kathy [name] from a cardiac standpoint she is doing quite well. She is a little bit down today because she just found out that one of her good friends passed away unexpectedly this past week.  Her cardiac standpoint she notes having some occasional palpitations off and on if she has more stress or caffeine but [name] is stable.   [name] Cardiovascular Review of Symptoms As Follows: No chest pain or shortness of breath with rest or exertion.  No PND, orthopnea or edema. (some dependent edema if sitting for a long time. No lightheadedness, weakness or syncope/near syncope. No TIA/amaurosis fugax symptoms. No claudication.   ROS: A comprehensive was performed. Review of Systems  Constitutional: Negative for malaise/fatigue and weight loss.  HENT: Negative for congestion and nosebleeds.   Respiratory: Negative for cough, shortness of breath and wheezing.     Gastrointestinal: Negative for blood in stool and melena.  Genitourinary: Negative for hematuria.  Musculoskeletal: Positive for joint pain (Left ankle pain and swelling. Concern for gout). Negative for falls and myalgias.  Skin: Negative.        Most notable for alopecia  Neurological: Positive for dizziness (On occasion if she gets stressed and feels flustered) and headaches (Not very often). Negative for weakness.  Psychiatric/Behavioral: Negative for depression and memory loss. The patient is nervous/anxious.     Past Medical History:  Diagnosis Date  . DM type 2 (diabetes mellitus, type 2) ([place])   . Dyslipidemia   . Heart palpitations    No documented Arrhythmia  . HTN (hypertension), benign    Prior History of Presumed Hypertensive Cardiomyopathy with LVH - not confrimed by Echo in 2010 & 2015    Past Surgical History:  Procedure Laterality Date  . TRANSTHORACIC ECHOCARDIOGRAM  2010; 09/2013   Normal LV thickness and size. EF 65-75%. Hyperdynamic. Much less notable LVH; mild aortic sclerosis with mild aortic insufficiency. No suggestion of hemodynamically significant stenosis.; b) 2015 - NormalLV size & function, EF 60-65% with Gr 1 DD, mild Ao Sclerosis with mild-mod AI   Current Meds  Medication Sig  . aspirin EC 81 MG tablet Take 81 mg by mouth as needed.  Marland Kitchen BYSTOLIC 10 MG tablet TAKE 1 TABLET EVERY DAY  . Cholecalciferol (VITAMIN D3) 2000 UNITS capsule Take 5,000 Units by mouth daily.   . Coenzyme Q10 (CO Q 10) 100 MG CAPS Take 300 mg by mouth daily.   Marland Kitchen JARDIANCE 25 MG  TABS tablet Take 12.5 mg by mouth 2 (two) times daily.   . metFORMIN (GLUCOPHAGE) 500 MG tablet Take 1,000 mg by mouth 2 (two) times daily.  . Multiple Vitamin (MULTIVITAMIN) tablet Take 1 tablet by mouth daily.  . Omega-3 Fatty Acids (EMULSIFIED OMEGA-3) [id number] MG/15ML LIQD Takes 2 teaspoons by mouth daily.  . [DISCONTINUED] hydrochlorothiazide (HYDRODIURIL) 25 MG tablet Take 1 tablet (25 mg total) by  mouth daily.    Allergies  Allergen Reactions  . Corn-Containing Products Anaphylaxis  . Ibuprofen Shortness Of Breath  . Peanuts [Peanut Oil] Anaphylaxis  . Penicillins Shortness Of Breath  . Shellfish Allergy Anaphylaxis  . Soy Allergy Anaphylaxis    Only sometimes   Social History   Social History  . Marital status: Unknown    Spouse name: N/A  . Number of children: N/A  . Years of education: N/A   Social History Main Topics  . Smoking status: Never Smoker  . Smokeless tobacco: Never Used  . Alcohol use No  . Drug use: No  . Sexual activity: Not Asked   Other Topics Concern  . None   Social History Narrative   Married, mother of 2.   He does not smoke or drink alcohol.   She tries to exercise, has been not as compliant as she had in the past.         Family History  Problem Relation Age of Onset  . Diabetes Brother   . Arrhythmia Brother   . Diabetes Brother     Wt Readings from Last 3 Encounters:  11/17/16 63.5 kg (140 lb)  05/19/16 65.9 kg (145 lb 3.2 oz)  12/30/15 68.1 kg (150 lb 3.2 oz)    PHYSICAL EXAM BP (!) 142/66   Pulse 80   Ht 5\' 3"  (1.6 m)   Wt 63.5 kg (140 lb)   BMI 24.80 kg/m   General appearance: alert, cooperative, appears stated age, no distress and well nourished, well groomed. HEENT: [place]/AT, EOMI, MMM, anicteric sclera  Neck: no adenopathy, no carotid bruit, no JVD; supple, with full range of motion. There is significant muscle tension along the lesser, mastoid and trapezius muscles. Radiated murmur  Lungs: clear to auscultation bilaterally, normal percussion bilaterally and Nonlabored, good air movement  Heart: normal apical impulse, regular rate and rhythm, S1, S2 normal, no S3 or S4 and 2/6 early peaking crescendo-decrescendo SEM heard at the RUSB  Abdomen: soft, non-tender; bowel sounds normal; no masses, no organomegaly  Extremities: extremities normal, atraumatic, no cyanosis or edema -= does have some mild swelling and  pain in the left ankle but [name] no significant edema. Pulses: 2+ and symmetric  Neurologic: Grossly normal   Adult ECG Report n/a   Other studies Reviewed: Additional studies/ records that were reviewed today include:  Recent Labs:  No results found for: CHOL, HDL, LDLCALC, LDLDIRECT, TRIG, CHOLHDL - Followed by PCP   ASSESSMENT / PLAN: Problem List Items Addressed This Visit    Aortic heart murmur (Chronic)    She did have aortic sclerosis on exam with murmur and on echocardiogram. Plan for now we to recheck to ensure there is no progression to stenosis.      Relevant Orders   ECHOCARDIOGRAM COMPLETE   Dyslipidemia associated with type 2 diabetes mellitus (HCC) (Chronic)    On Crestor, omega-3 fatty acids and CoQ10. Tolerating well. Labs monitored by PCP.      Heart palpitations (Chronic)    Controlled on Beta Blocker. - If more BP  control is needed - could increase Bystolic.      Relevant Orders   ECHOCARDIOGRAM COMPLETE   Moderate essential hypertension - Primary (Chronic)    Plan for now will be to go back to the 12.5 mg of HCTZ which was similar to the dosing that she was on when she was taking Diovan HCT. Have her hold H TTG for 3 days and then restart at 12.5 mg daily. Also increase amlodipine to 10 mg daily.  She will follow-up with [facility] Pharmacist Team as scheduled       Relevant Medications   amLODipine (NORVASC) 10 MG tablet   hydrochlorothiazide (MICROZIDE) 12.5 MG capsule   Other Relevant Orders   ECHOCARDIOGRAM COMPLETE     PATIENT INSTRUCTIONS: Current medicines are reviewed at length with the patient today. (+/- concerns) none The following changes have been made: None  Patient Instructions  Medication change to: Hold for three days and then start Hydrochlorothiazide 12.5 mg every other day.   Amlodipine increased to 10mg  daily   Schedule at [address] in July/August 2018 Your physician has requested that you have an  echocardiogram. Echocardiography is a painless test that uses sound waves to create images of your heart. It provides your doctor with information about the size and shape of your heart and how well your heart's chambers and valves are working. This procedure takes approximately one hour. There are no restrictions for this procedure.  Your physician wants you to follow-up in: 6 months with Dr. Ellyn Hack. You will receive a reminder letter in the mail two months in advance. If you don't receive a letter, please call our office to schedule the follow-up appointment.    Studies Ordered:   Orders Placed This Encounter  Procedures  . ECHOCARDIOGRAM COMPLETE       Glenetta Hew, M.D., M.S. Interventional Cardiologist   Pager # [phone] Phone # [phone] [address] [place], [place] [address]

## 2016-11-20 NOTE — Assessment & Plan Note (Signed)
Controlled on Beta Blocker. - If more BP control is needed - could increase Bystolic.

## 2016-11-20 NOTE — Assessment & Plan Note (Signed)
She did have aortic sclerosis on exam with murmur and on echocardiogram. Plan for now we to recheck to ensure there is no progression to stenosis.

## 2016-11-20 NOTE — Assessment & Plan Note (Signed)
Plan for now will be to go back to the 12.5 mg of HCTZ which was similar to the dosing that she was on when she was taking Diovan HCT. Have her hold H TTG for 3 days and then restart at 12.5 mg daily. Also increase amlodipine to 10 mg daily.  She will follow-up with [facility] Pharmacist Team as scheduled

## 2016-11-20 NOTE — Assessment & Plan Note (Signed)
On Crestor, omega-3 fatty acids and CoQ10. Tolerating well. Labs monitored by PCP.

## 2016-12-01 DIAGNOSIS — G245 Blepharospasm: Secondary | ICD-10-CM | POA: Diagnosis not present

## 2016-12-01 DIAGNOSIS — E119 Type 2 diabetes mellitus without complications: Secondary | ICD-10-CM | POA: Diagnosis not present

## 2016-12-01 DIAGNOSIS — H01021 Squamous blepharitis right upper eyelid: Secondary | ICD-10-CM | POA: Diagnosis not present

## 2016-12-01 DIAGNOSIS — H01025 Squamous blepharitis left lower eyelid: Secondary | ICD-10-CM | POA: Diagnosis not present

## 2016-12-01 DIAGNOSIS — Z880 Allergy status to penicillin: Secondary | ICD-10-CM | POA: Diagnosis not present

## 2016-12-01 DIAGNOSIS — Z886 Allergy status to analgesic agent status: Secondary | ICD-10-CM | POA: Diagnosis not present

## 2016-12-01 DIAGNOSIS — H169 Unspecified keratitis: Secondary | ICD-10-CM | POA: Diagnosis not present

## 2016-12-01 DIAGNOSIS — I10 Essential (primary) hypertension: Secondary | ICD-10-CM | POA: Diagnosis not present

## 2016-12-01 DIAGNOSIS — H01024 Squamous blepharitis left upper eyelid: Secondary | ICD-10-CM | POA: Diagnosis not present

## 2016-12-01 DIAGNOSIS — Z7984 Long term (current) use of oral hypoglycemic drugs: Secondary | ICD-10-CM | POA: Diagnosis not present

## 2016-12-01 DIAGNOSIS — H01022 Squamous blepharitis right lower eyelid: Secondary | ICD-10-CM | POA: Diagnosis not present

## 2016-12-01 DIAGNOSIS — Z7982 Long term (current) use of aspirin: Secondary | ICD-10-CM | POA: Diagnosis not present

## 2016-12-11 DIAGNOSIS — H2513 Age-related nuclear cataract, bilateral: Secondary | ICD-10-CM | POA: Diagnosis not present

## 2016-12-11 DIAGNOSIS — H52203 Unspecified astigmatism, bilateral: Secondary | ICD-10-CM | POA: Diagnosis not present

## 2016-12-11 DIAGNOSIS — H5203 Hypermetropia, bilateral: Secondary | ICD-10-CM | POA: Diagnosis not present

## 2016-12-11 DIAGNOSIS — E113293 Type 2 diabetes mellitus with mild nonproliferative diabetic retinopathy without macular edema, bilateral: Secondary | ICD-10-CM | POA: Diagnosis not present

## 2016-12-11 DIAGNOSIS — H524 Presbyopia: Secondary | ICD-10-CM | POA: Diagnosis not present

## 2016-12-14 ENCOUNTER — Ambulatory Visit: Payer: Medicare Other

## 2016-12-19 DIAGNOSIS — E790 Hyperuricemia without signs of inflammatory arthritis and tophaceous disease: Secondary | ICD-10-CM | POA: Diagnosis not present

## 2016-12-19 DIAGNOSIS — I10 Essential (primary) hypertension: Secondary | ICD-10-CM | POA: Diagnosis not present

## 2016-12-19 DIAGNOSIS — E118 Type 2 diabetes mellitus with unspecified complications: Secondary | ICD-10-CM | POA: Diagnosis not present

## 2017-01-02 ENCOUNTER — Ambulatory Visit: Payer: Medicare Other

## 2017-03-27 ENCOUNTER — Other Ambulatory Visit (HOSPITAL_COMMUNITY): Payer: Medicare Other

## 2017-04-18 ENCOUNTER — Other Ambulatory Visit: Payer: Self-pay

## 2017-04-18 ENCOUNTER — Ambulatory Visit (HOSPITAL_COMMUNITY): Payer: Medicare Other | Attending: Cardiology

## 2017-04-18 DIAGNOSIS — E785 Hyperlipidemia, unspecified: Secondary | ICD-10-CM | POA: Insufficient documentation

## 2017-04-18 DIAGNOSIS — E119 Type 2 diabetes mellitus without complications: Secondary | ICD-10-CM | POA: Insufficient documentation

## 2017-04-18 DIAGNOSIS — R002 Palpitations: Secondary | ICD-10-CM

## 2017-04-18 DIAGNOSIS — I351 Nonrheumatic aortic (valve) insufficiency: Secondary | ICD-10-CM | POA: Diagnosis not present

## 2017-04-18 DIAGNOSIS — I10 Essential (primary) hypertension: Secondary | ICD-10-CM | POA: Diagnosis not present

## 2017-04-18 DIAGNOSIS — I358 Other nonrheumatic aortic valve disorders: Secondary | ICD-10-CM | POA: Diagnosis not present

## 2017-04-18 DIAGNOSIS — I359 Nonrheumatic aortic valve disorder, unspecified: Secondary | ICD-10-CM | POA: Diagnosis present

## 2017-04-19 ENCOUNTER — Telehealth: Payer: Self-pay | Admitting: *Deleted

## 2017-04-19 NOTE — Telephone Encounter (Signed)
-----   Message from Leonie Man, MD sent at 04/18/2017  5:58 PM EDT ----- Echo Result: 04/18/2017 --> stable echo findings compared to last study.  Normal LV size and function. EF 60-65%. GR 1 DD. Mild aortic regurgitation. Mild calcification/sclerosis but no stenosis. Stable mild aortic regurgitation.  Can recheck in 4-5 yrs.  Glenetta Hew, MD   pls forward to PCP: Anda Kraft, MD

## 2017-04-19 NOTE — Telephone Encounter (Signed)
Spoke to patient. Result given . Verbalized understanding  Patient states she will call back to set up appointment with [facility] to discuss the change in taking HCTZ per DR KOHUT  REQUEST.   and call for recall f/u appointment for her and husband .

## 2017-04-20 ENCOUNTER — Other Ambulatory Visit: Payer: Self-pay | Admitting: Cardiology

## 2017-04-23 NOTE — Telephone Encounter (Signed)
REFILL 

## 2017-04-23 NOTE — Telephone Encounter (Signed)
New Message   *STAT* If patient is at the pharmacy, call can be transferred to refill team.   1. Which medications need to be refilled? (please list name of each medication and dose if known) bystolic 10mg    2. Which pharmacy/location (including street and city if local pharmacy) is medication to be sent to? [facility] [address] [place]   3. Do they need a 30 day or 90 day supply? 90  Per pt is going out of town today and did not have her medication to take on yesterday. Please call back to discuss

## 2017-06-15 ENCOUNTER — Other Ambulatory Visit: Payer: Self-pay | Admitting: *Deleted

## 2017-06-19 ENCOUNTER — Other Ambulatory Visit: Payer: Self-pay | Admitting: Cardiology

## 2017-06-29 ENCOUNTER — Ambulatory Visit (INDEPENDENT_AMBULATORY_CARE_PROVIDER_SITE_OTHER): Payer: Medicare Other | Admitting: Cardiology

## 2017-06-29 ENCOUNTER — Encounter: Payer: Self-pay | Admitting: Cardiology

## 2017-06-29 VITALS — BP 140/76 | HR 72 | Ht 64.0 in | Wt 143.6 lb

## 2017-06-29 DIAGNOSIS — R002 Palpitations: Secondary | ICD-10-CM

## 2017-06-29 DIAGNOSIS — I10 Essential (primary) hypertension: Secondary | ICD-10-CM

## 2017-06-29 DIAGNOSIS — I358 Other nonrheumatic aortic valve disorders: Secondary | ICD-10-CM
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
## 2017-06-29 NOTE — Patient Instructions (Addendum)
No changes with current medications    Your physician wants you to follow-up in  6 months with DR HARDING.You will receive a reminder letter in the mail two months in advance. If you don't receive a letter, please call our office to schedule the follow-up appointment.   If you need a refill on your cardiac medications before your next appointment, please call your pharmacy.

## 2017-07-01 ENCOUNTER — Encounter: Payer: Self-pay | Admitting: Cardiology

## 2017-07-01 NOTE — Assessment & Plan Note (Signed)
Stable echo with aortic sclerosis and no stenosis. Follow-up in 3 years.

## 2017-07-01 NOTE — Assessment & Plan Note (Signed)
Well-controlled. Her PCP was noticing concerned about being on HCTZ. I think we can simply make this a when necessary medication. If necessary we could push amlodipine at 10 mg. She is on Bystolic as well which has controlled her palpitations.

## 2017-07-01 NOTE — Assessment & Plan Note (Signed)
Well-controlled on Bystolic. She had been intolerant to beta blockers. We could potentially increase the dose for worsening hypertension or palpitations.

## 2017-07-16 ENCOUNTER — Other Ambulatory Visit: Payer: Self-pay | Admitting: Cardiology

## 2017-07-16 NOTE — Telephone Encounter (Signed)
REFILL 

## 2017-08-23 DIAGNOSIS — E789 Disorder of lipoprotein metabolism, unspecified: Secondary | ICD-10-CM | POA: Diagnosis not present

## 2017-08-23 DIAGNOSIS — I10 Essential (primary) hypertension: Secondary | ICD-10-CM | POA: Diagnosis not present

## 2017-08-23 DIAGNOSIS — E118 Type 2 diabetes mellitus with unspecified complications: Secondary | ICD-10-CM | POA: Diagnosis not present

## 2017-10-05 DIAGNOSIS — I10 Essential (primary) hypertension: Secondary | ICD-10-CM | POA: Diagnosis not present

## 2017-10-05 DIAGNOSIS — E118 Type 2 diabetes mellitus with unspecified complications: Secondary | ICD-10-CM | POA: Diagnosis not present

## 2017-10-12 DIAGNOSIS — E118 Type 2 diabetes mellitus with unspecified complications: Secondary | ICD-10-CM | POA: Diagnosis not present

## 2017-10-12 DIAGNOSIS — I10 Essential (primary) hypertension: Secondary | ICD-10-CM | POA: Diagnosis not present

## 2017-10-12 DIAGNOSIS — E7800 Pure hypercholesterolemia, unspecified: Secondary | ICD-10-CM | POA: Diagnosis not present

## 2017-10-15 ENCOUNTER — Other Ambulatory Visit: Payer: Self-pay

## 2017-10-15 MED ORDER — NEBIVOLOL HCL 10 MG PO TABS: 10.0000 mg | ORAL_TABLET | Freq: Every day | ORAL | 2 refills | Status: DC

## 2017-10-15 NOTE — Telephone Encounter (Signed)
Rx(s) sent to pharmacy electronically.  

## 2018-01-13 ENCOUNTER — Other Ambulatory Visit: Payer: Self-pay | Admitting: Cardiology

## 2018-01-14 NOTE — Telephone Encounter (Signed)
Rx sent to pharmacy   

## 2018-03-11 DIAGNOSIS — E118 Type 2 diabetes mellitus with unspecified complications: Secondary | ICD-10-CM | POA: Diagnosis not present

## 2018-03-25 ENCOUNTER — Other Ambulatory Visit: Payer: Self-pay | Admitting: Cardiology

## 2018-03-25 NOTE — Telephone Encounter (Signed)
Rx sent to pharmacy   

## 2018-04-17 ENCOUNTER — Other Ambulatory Visit: Payer: Self-pay | Admitting: Cardiology

## 2018-04-17 NOTE — Telephone Encounter (Signed)
Rx sent to pharmacy   

## 2018-05-01 ENCOUNTER — Other Ambulatory Visit: Payer: Self-pay | Admitting: Endocrinology

## 2018-05-01 DIAGNOSIS — Z1231 Encounter for screening mammogram for malignant neoplasm of breast: Secondary | ICD-10-CM

## 2018-05-02 ENCOUNTER — Other Ambulatory Visit: Payer: Self-pay | Admitting: Internal Medicine

## 2018-05-02 DIAGNOSIS — R5381 Other malaise: Secondary | ICD-10-CM

## 2018-05-07 ENCOUNTER — Other Ambulatory Visit: Payer: Self-pay | Admitting: Cardiology

## 2018-05-08 NOTE — Telephone Encounter (Signed)
Rx sent to pharmacy   

## 2018-06-03 ENCOUNTER — Encounter: Payer: Self-pay | Admitting: Cardiology

## 2018-06-03 ENCOUNTER — Ambulatory Visit (INDEPENDENT_AMBULATORY_CARE_PROVIDER_SITE_OTHER): Payer: Medicare Other | Admitting: Cardiology

## 2018-06-03 VITALS — BP 164/72 | HR 76 | Ht 64.0 in | Wt 144.6 lb

## 2018-06-03 DIAGNOSIS — I358 Other nonrheumatic aortic valve disorders: Secondary | ICD-10-CM | POA: Diagnosis not present

## 2018-06-03 DIAGNOSIS — E1169 Type 2 diabetes mellitus with other specified complication: Secondary | ICD-10-CM

## 2018-06-03 DIAGNOSIS — I10 Essential (primary) hypertension: Secondary | ICD-10-CM

## 2018-06-03 DIAGNOSIS — E785 Hyperlipidemia, unspecified: Secondary | ICD-10-CM

## 2018-06-03 DIAGNOSIS — R002 Palpitations: Secondary | ICD-10-CM

## 2018-06-03 MED ORDER — CRESTOR 5 MG PO TABS: ORAL_TABLET | ORAL | 3 refills | Status: DC

## 2018-06-03 NOTE — Patient Instructions (Addendum)
Medication changes  CRESTOR 5 MG  start taking one tablet at bedtime  3 times a week for 2 to 3 weeks ,if able to tolerate increase to one tablet 5 times a week.    Okay to to do cardiac rehab phase III-     Your physician wants you to follow-up in 6 months with DR HARDING. You will receive a reminder letter in the mail two months in advance. If you don't receive a letter, please call our office to schedule the follow-up appointment.   If you need a refill on your cardiac medications before your next appointment, please call your pharmacy.

## 2018-06-03 NOTE — Progress Notes (Signed)
PCP: Jani Gravel, MD ([employer] retired)  Clinic Note: Chief Complaint  Patient presents with  . Follow-up    pt reports increased stress, odd discomfort in chest  . Hypertension   HPI: Kathy [name] is a 77 y.o. female with a PMH notable for HTN, palpitations & AoV Sclerosis, dysplipidemia who presents today for ~ annual f/u.   Kathy [name] was last seen on Jun 29, 2017.  Blood pressure was well controlled.  Palpitations were controlled.  Was doing that she could to avoid her triggers.  Recent Hospitalizations: none Studies Personally Reviewed - (if available, images/films reviewed: From Epic Chart or Care Everywhere)  No new  Interval History: Kathy [name] presents here today really stable from a cardiac standpoint.  She is under a lot of stress because her husband Chief of Staff) has been having health issues.  She has been under a lot of stress and not really been taking care of herself. She is feels an odd sort of sensation in her chest off and on that lasts may be a second or 2.  There is no association with any particular activity but it does increase in frequency when she is under more stress or anxiety.  Usually she is able to stop and relax the symptoms improved. The chest discomfort is not associated with exertion.  She does not have any real exertional dyspnea or PND, orthopnea.  Minimal edema.  She will get short of breath if she goes upstairs faster or tries to do something too quickly, but this resolves relatively quickly as well.  She stopped taking her Crestor because of cramping issues and has not restarted.  No palpitations, lightheadedness, dizziness, weakness or syncope/near syncope. No TIA/amaurosis fugax symptoms. No melena, hematochezia, hematuria, or epstaxis. No claudication.  ROS: A comprehensive was performed. Review of Systems  Constitutional: Negative for malaise/fatigue and weight loss.  HENT: Negative for ear pain.   Respiratory: Negative for cough.     Musculoskeletal: Positive for back pain and joint pain (Left shoulder pain).  Neurological: Negative for dizziness (Only if she is flush or anxious. =--Rarely gets dizzy), focal weakness, weakness and headaches.  Psychiatric/Behavioral: Negative for depression and memory loss. The patient is nervous/anxious.   All other systems reviewed and are negative.  I have reviewed and (if needed) personally updated the patient's problem list, medications, allergies, past medical and surgical history, social and family history.   Past Medical History:  Diagnosis Date  . DM type 2 (diabetes mellitus, type 2) ([place])   . Dyslipidemia   . Heart palpitations    No documented Arrhythmia  . HTN (hypertension), benign    Prior History of Presumed Hypertensive Cardiomyopathy with LVH - not confrimed by Echo in 2010 & 2015    Past Surgical History:  Procedure Laterality Date  . TRANSTHORACIC ECHOCARDIOGRAM  January 2015, August 2018   a) 2015 - NormalLV size & function, EF 60-65% with Gr 1 DD, mild Ao Sclerosis with mild-mod AI;; b) no significant change    Current Meds  Medication Sig  . amLODipine (NORVASC) 5 MG tablet TAKE 1 TABLET (5 MG TOTAL) BY MOUTH DAILY. PLEASE CALL TO MAKE APPOINTMENT FOR FURTHER REFILLS  . aspirin EC 81 MG tablet Take 81 mg by mouth as needed.  . Cholecalciferol (VITAMIN D3) 2000 UNITS capsule Take 5,000 Units by mouth daily.   . Coenzyme Q10 (CO Q 10) 100 MG CAPS Take 300 mg by mouth daily.   Marland Kitchen JARDIANCE 25 MG TABS tablet Take  12.5 mg by mouth 2 (two) times daily.   . metFORMIN (GLUCOPHAGE) 500 MG tablet Take 1,000 mg by mouth 2 (two) times daily.  . Multiple Vitamin (MULTIVITAMIN) tablet Take 1 tablet by mouth daily.  . nebivolol (BYSTOLIC) 10 MG tablet Take 1 tablet (10 mg total) by mouth daily.  . Omega-3 Fatty Acids (EMULSIFIED OMEGA-3) 294-7654 MG/15ML LIQD Takes 2 teaspoons by mouth daily.    Allergies  Allergen Reactions  . Corn-Containing Products Anaphylaxis   . Ibuprofen Shortness Of Breath  . Peanuts [Peanut Oil] Anaphylaxis  . Penicillins Shortness Of Breath  . Shellfish Allergy Anaphylaxis  . Soy Allergy Anaphylaxis    Only sometimes    Social History   Tobacco Use  . Smoking status: Never Smoker  . Smokeless tobacco: Never Used  Substance Use Topics  . Alcohol use: No  . Drug use: No   Social History   Social History Narrative   Married, mother of 2.   He does not smoke or drink alcohol.   She tries to exercise, has been not as compliant as she had in the past.          family history includes Arrhythmia in her brother; Diabetes in her brother and brother.  Wt Readings from Last 3 Encounters:  06/03/18 144 lb 9.6 oz (65.6 kg)  06/29/17 143 lb 9.6 oz (65.1 kg)  11/17/16 140 lb (63.5 kg)    PHYSICAL EXAM BP (!) 164/72   Pulse 76   Ht 5\' 4"  (1.626 m)   Wt 144 lb 9.6 oz (65.6 kg)   BMI 24.82 kg/m  Physical Exam  Constitutional: She is oriented to person, place, and time. She appears well-developed and well-nourished. No distress.  HENT:  Head: Normocephalic and atraumatic.  Neck: Normal range of motion. Neck supple. No hepatojugular reflux and no JVD present. Carotid bruit is not present. No thyromegaly present.  Cardiovascular: Normal rate, normal heart sounds and intact distal pulses. Exam reveals no gallop and no friction rub.  Pulmonary/Chest: Effort normal and breath sounds normal. No respiratory distress. She has no wheezes. She has no rales.  Abdominal: Soft. Bowel sounds are normal.  Musculoskeletal: Normal range of motion. She exhibits edema (1+).  Neurological: She is alert and oriented to person, place, and time.  Psychiatric: She has a normal mood and affect. Her behavior is normal. Judgment and thought content normal.  Somewhat rapid speech.  Lots of complaints about various minor details.  Vitals reviewed.    Cardiovascular: Normal rate, regular rhythm and normal pulses.   Extrasystoles are present.  PMI is not displaced.  Exam reveals no gallop.   Murmur heard.  Medium-pitched harsh crescendo-decrescendo early systolic murmur is present with a grade of 1/6  at the upper right sternal border radiating to the neck Pulmonary/Chest: Effort normal and breath sounds    Adult ECG Report  Rate: 76 ;  Rhythm: normal sinus rhythm and Normal axis, intervals and durations.;   Narrative Interpretation: Normal EKG   Other studies Reviewed: Additional studies/ records that were reviewed today include:  Recent Labs: March 11, 2018: TC 234, TG 104, LDL 167, HDL 46.  A1c 8.4.  6 creatinine 1.09.   ASSESSMENT / PLAN: Problem List Items Addressed This Visit    Aortic heart murmur (Chronic)    Sclerosis not stenosis.  Plan follow-up in 2 years      Relevant Orders   EKG 12-Lead (Completed)   AMB referral to cardiac rehabilitation   Dyslipidemia associated  with type 2 diabetes mellitus ([place]) (Chronic)    She had stopped her statin and her lipids went the wrong direction.  I would like her to start back at least 3 days a week and then try to titrate up      Relevant Medications   CRESTOR 5 MG tablet   Heart palpitations - Primary (Chronic)    Seem to be relatively stabilized as long as she does not get into her triggers. Continue current stable dose of Bystolic, however her blood pressures further increase, we may need to increase the dose at that time.      Relevant Orders   EKG 12-Lead (Completed)   AMB referral to cardiac rehabilitation   Moderate essential hypertension (Chronic)    Blood pressure is pretty high today in clinic, however she tells me that usually is 130/70 range at home.  She seems to be quite amped up today.  We will continue to monitor for now but low threshold for increasing therapy.  Would probably increase Bystolic to 20 mg if her pressures continue to be elevated.      Relevant Medications   CRESTOR 5 MG tablet   Other Relevant Orders   EKG 12-Lead (Completed)    AMB referral to cardiac rehabilitation       Current medicines are reviewed at length with the patient today.  (+/- concerns) none The following changes have been made:  See below  Patient Instructions  Medication changes  CRESTOR 5 MG  start taking one tablet at bedtime  3 times a week for 2 to 3 weeks ,if able to tolerate increase to one tablet 5 times a week.    Okay to to do cardiac rehab phase III-     Your physician wants you to follow-up in 6 months with DR HARDING. You will receive a reminder letter in the mail two months in advance. If you don't receive a letter, please call our office to schedule the follow-up appointment.   If you need a refill on your cardiac medications before your next appointment, please call your pharmacy.     Studies Ordered:   Orders Placed This Encounter  Procedures  . AMB referral to cardiac rehabilitation  . EKG 12-Lead      Glenetta Hew, M.D., M.S. Interventional Cardiologist   Pager # [phone] Phone # [phone] [address] [place], [place] [address]   Thank you for choosing [facility] at [facility]!!

## 2018-06-04 ENCOUNTER — Ambulatory Visit
Admission: RE | Admit: 2018-06-04 | Discharge: 2018-06-04 | Disposition: A | Discharge status: Home / Self Care | Payer: Medicare Other | Source: Ambulatory Visit | Attending: Endocrinology | Admitting: Endocrinology

## 2018-06-04 ENCOUNTER — Telehealth: Payer: Self-pay | Admitting: Cardiology

## 2018-06-04 ENCOUNTER — Other Ambulatory Visit: Payer: Self-pay | Admitting: Internal Medicine

## 2018-06-04 DIAGNOSIS — Z1231 Encounter for screening mammogram for malignant neoplasm of breast: Secondary | ICD-10-CM | POA: Diagnosis not present

## 2018-06-04 NOTE — Telephone Encounter (Signed)
New Message:      Kathy [name] from [facility] stated the wrong person information was sent over

## 2018-06-04 NOTE — Telephone Encounter (Signed)
Received call from [facility] with [facility]. Per Loma Sousa she received information on patient regarding getting into the maintenance program. She had spoken with Ivin Booty and patient wanted to go to this location secondary to her husband going there. Per Loma Sousa there is no one enrolled in the program by the name of the husband. Tried to call the patient to find out exactly where she wanted to try to go to rehab. Left message to call back

## 2018-06-05 ENCOUNTER — Encounter: Payer: Self-pay | Admitting: Cardiology

## 2018-06-05 NOTE — Assessment & Plan Note (Signed)
Sclerosis not stenosis.  Plan follow-up in 2 years

## 2018-06-05 NOTE — Assessment & Plan Note (Signed)
Blood pressure is pretty high today in clinic, however she tells me that usually is 130/70 range at home.  She seems to be quite amped up today.  We will continue to monitor for now but low threshold for increasing therapy.  Would probably increase Bystolic to 20 mg if her pressures continue to be elevated.

## 2018-06-05 NOTE — Assessment & Plan Note (Signed)
Seem to be relatively stabilized as long as she does not get into her triggers. Continue current stable dose of Bystolic, however her blood pressures further increase, we may need to increase the dose at that time.

## 2018-06-05 NOTE — Assessment & Plan Note (Signed)
She had stopped her statin and her lipids went the wrong direction.  I would like her to start back at least 3 days a week and then try to titrate up

## 2018-06-06 MED ORDER — AMLODIPINE BESYLATE 5 MG PO TABS: 5.0000 mg | ORAL_TABLET | Freq: Every day | ORAL | 3 refills | Status: DC

## 2018-06-06 MED ORDER — CRESTOR 5 MG PO TABS: ORAL_TABLET | ORAL | 3 refills | Status: DC

## 2018-06-06 NOTE — Telephone Encounter (Signed)
Returned call to pt she states that she was wanting to get orders for cardiac rehab so they could both go together so that she can make sure that he attends. Her husbands name is Tashina, Credit mrn # [id number]. So please order cardiac rehab for both patients. At [facility] please.  Spoke with courtney at [facility] she states that she is fine setting this couple up for cardiac but she does need a few progress notes for both patients. We already have the authorization for pt Kathy [name] to start, but we will need ok and EF for husband and what level he will be starting cardiac rehab.weather it will be III like the wife or II, courtney thinks that he will qualify for II with the dx of I25.5-ischemic myopathy. All notes forwarded [facility] to [facility]-CourtneyWhat level do we want to start him at? Please advise.

## 2018-06-07 ENCOUNTER — Other Ambulatory Visit: Payer: Self-pay | Admitting: Internal Medicine

## 2018-06-07 DIAGNOSIS — Z780 Asymptomatic menopausal state: Secondary | ICD-10-CM

## 2018-06-07 NOTE — Telephone Encounter (Signed)
PER DR HARDING , OKAY FOR CARDIAC REHAB WILL FAX ORDER TO [facility] IN [place]

## 2018-06-12 DIAGNOSIS — E7800 Pure hypercholesterolemia, unspecified: Secondary | ICD-10-CM | POA: Diagnosis not present

## 2018-06-12 DIAGNOSIS — M8580 Other specified disorders of bone density and structure, unspecified site: Secondary | ICD-10-CM | POA: Diagnosis not present

## 2018-06-12 DIAGNOSIS — E118 Type 2 diabetes mellitus with unspecified complications: Secondary | ICD-10-CM | POA: Diagnosis not present

## 2018-06-12 DIAGNOSIS — I10 Essential (primary) hypertension: Secondary | ICD-10-CM | POA: Diagnosis not present

## 2018-07-19 ENCOUNTER — Other Ambulatory Visit: Payer: Self-pay | Admitting: Cardiology

## 2018-07-24 DIAGNOSIS — G245 Blepharospasm: Secondary | ICD-10-CM | POA: Diagnosis not present

## 2018-07-24 DIAGNOSIS — H0102B Squamous blepharitis left eye, upper and lower eyelids: Secondary | ICD-10-CM | POA: Diagnosis not present

## 2018-07-24 DIAGNOSIS — H0102A Squamous blepharitis right eye, upper and lower eyelids: Secondary | ICD-10-CM | POA: Diagnosis not present

## 2018-07-29 DIAGNOSIS — E7800 Pure hypercholesterolemia, unspecified: Secondary | ICD-10-CM | POA: Diagnosis not present

## 2018-07-29 DIAGNOSIS — I10 Essential (primary) hypertension: Secondary | ICD-10-CM | POA: Diagnosis not present

## 2018-07-29 DIAGNOSIS — N390 Urinary tract infection, site not specified: Secondary | ICD-10-CM | POA: Diagnosis not present

## 2018-07-29 DIAGNOSIS — E118 Type 2 diabetes mellitus with unspecified complications: Secondary | ICD-10-CM | POA: Diagnosis not present

## 2018-07-31 DIAGNOSIS — H5203 Hypermetropia, bilateral: Secondary | ICD-10-CM | POA: Diagnosis not present

## 2018-07-31 DIAGNOSIS — H52203 Unspecified astigmatism, bilateral: Secondary | ICD-10-CM | POA: Diagnosis not present

## 2018-07-31 DIAGNOSIS — H25813 Combined forms of age-related cataract, bilateral: Secondary | ICD-10-CM | POA: Diagnosis not present

## 2018-07-31 DIAGNOSIS — H524 Presbyopia: Secondary | ICD-10-CM | POA: Diagnosis not present

## 2018-07-31 DIAGNOSIS — E113293 Type 2 diabetes mellitus with mild nonproliferative diabetic retinopathy without macular edema, bilateral: Secondary | ICD-10-CM | POA: Diagnosis not present

## 2018-08-06 DIAGNOSIS — I10 Essential (primary) hypertension: Secondary | ICD-10-CM | POA: Diagnosis not present

## 2018-08-06 DIAGNOSIS — E789 Disorder of lipoprotein metabolism, unspecified: Secondary | ICD-10-CM | POA: Diagnosis not present

## 2018-08-06 DIAGNOSIS — E118 Type 2 diabetes mellitus with unspecified complications: Secondary | ICD-10-CM | POA: Diagnosis not present

## 2018-08-06 DIAGNOSIS — E7800 Pure hypercholesterolemia, unspecified: Secondary | ICD-10-CM | POA: Diagnosis not present

## 2018-09-02 DIAGNOSIS — G245 Blepharospasm: Secondary | ICD-10-CM | POA: Diagnosis not present

## 2018-09-06 DIAGNOSIS — Z780 Asymptomatic menopausal state: Secondary | ICD-10-CM | POA: Diagnosis not present

## 2018-10-16 DIAGNOSIS — H04123 Dry eye syndrome of bilateral lacrimal glands: Secondary | ICD-10-CM | POA: Diagnosis not present

## 2018-10-16 DIAGNOSIS — G245 Blepharospasm: Secondary | ICD-10-CM | POA: Diagnosis not present

## 2018-10-21 ENCOUNTER — Other Ambulatory Visit: Payer: Self-pay | Admitting: Cardiology

## 2018-10-21 ENCOUNTER — Encounter (INDEPENDENT_AMBULATORY_CARE_PROVIDER_SITE_OTHER): Payer: Self-pay

## 2018-10-21 ENCOUNTER — Encounter: Payer: Self-pay | Admitting: Cardiology

## 2018-10-21 ENCOUNTER — Ambulatory Visit (INDEPENDENT_AMBULATORY_CARE_PROVIDER_SITE_OTHER): Payer: Medicare Other | Admitting: Cardiology

## 2018-10-21 VITALS — BP 162/80 | HR 77 | Ht 64.0 in | Wt 139.6 lb

## 2018-10-21 DIAGNOSIS — R002 Palpitations: Secondary | ICD-10-CM

## 2018-10-21 DIAGNOSIS — E785 Hyperlipidemia, unspecified: Secondary | ICD-10-CM

## 2018-10-21 DIAGNOSIS — E1169 Type 2 diabetes mellitus with other specified complication: Secondary | ICD-10-CM | POA: Diagnosis not present

## 2018-10-21 DIAGNOSIS — I10 Essential (primary) hypertension: Secondary | ICD-10-CM

## 2018-10-21 MED ORDER — NEBIVOLOL HCL 10 MG PO TABS: ORAL_TABLET | ORAL | 3 refills | Status: DC

## 2018-10-21 NOTE — Patient Instructions (Addendum)
Medication Instructions:   alternate taking 10 mg one day then 20 mg the other day  BYSTOLIC  If you need a refill on your cardiac medications before your next appointment, please call your pharmacy.   Lab work: Not needed If you have labs (blood work) drawn today and your tests are completely normal, you will receive your results only by: Marland Kitchen MyChart Message (if you have MyChart) OR . A paper copy in the mail If you have any lab test that is abnormal or we need to change your treatment, we will call you to review the results.  Testing/Procedures: Not needed  Follow-Up: At [facility], you and your health needs are our priority.  As part of our continuing mission to provide you with exceptional heart care, we have created designated Provider Care Teams.  These Care Teams include your primary Cardiologist (physician) and Advanced Practice Providers (APPs -  Physician Assistants and Nurse Practitioners) who all work together to provide you with the care you need, when you need it. You will need a follow up appointment in 4 months.  Please call our office 2 months in advance to schedule this appointment.  You may see Glenetta Hew, MD or one of the following Advanced Practice Providers on your designated Care Team:   Rosaria Ferries, PA-C . Jory Sims, DNP, ANP  Any Other Special Instructions Will Be Listed Below (If Applicable).  \

## 2018-10-21 NOTE — Progress Notes (Signed)
PCP: Jani Gravel, MD ([employer] retired)  Clinic Note: Chief Complaint  Patient presents with  . Follow-up    Left arm pain.  ->  Some anxiety and sadness after the loss of her husband  . Hypertension    Blood pressure up.  Also palpitations   HPI: Kathy [name] is a 78 y.o. female with a PMH notable for HTN, palpitations & AoV Sclerosis, dysplipidemia who presents today for ~ annual f/u.   Kathy [name] was last seen on Sept 2019.  stable from a cardiac standpoint.  She is under a lot of stress because her husband Chief of Staff) has been having health issues.  Husband (had significant ischemic cardiomyopathy after completed anterior infarct -over 8 years ago.  Had been stable from a cardiac standpoint) recently passed --> mid December.  Had severe abdominal pain with projectile vomiting.  Went to ER was found to be in shock with acute renal and liver failure.  Got progressively worse over the course of the morning after arrival to the ER and eventually was made comfort care.  Recent Hospitalizations:   none  Studies Personally Reviewed - (if available, images/films reviewed: From Epic Chart or Care Everywhere)  No new   Interval History: Kathy [name] presents here today really because she has been having some intermittent left-sided arm pain that Comes and goes.  Weird sensations that lasts maybe a second or 2 but nothing prolonged.  She has been intermittently having spells of just being upset and tearful since the loss of her husband.  She is having a hard time adapting to taking on all the rules that he had done such as yard work, mopping, driving and feeling the truck up with gas.  She does have lots of friends and family to keep her company.  She is hoping to start a get back into an exercise regimen as way of relieving her stress.  She was just, worried about these left-sided arm pains that last only a few seconds.  Not associated with rest or exertion in any particular rhyme or reason.   No associated chest discomfort, no dyspnea.  She has a few palpitations but nothing prolonged.  No PND, orthopnea or edema.  No syncope/near syncope or TIA symptoms fugax.  No longer taking statin.  No headache or blurred vision.  No dizziness or wooziness.  She restarted taking the intermittent dosing of Crestor.  No claudication.  Trying to figure out what to do with her house.  Debating whether or not she stays in the house, or moves into a retirement community because of difficulty with upkeep.  ROS: A comprehensive was performed. Review of Systems  Constitutional: Negative for malaise/fatigue and weight loss.  HENT: Negative for ear pain.   Respiratory: Negative for cough.   Musculoskeletal: Positive for back pain and joint pain (Left shoulder pain).       Intermittent left-sided arm pain  Neurological: Negative for dizziness (Only if she is flush or anxious. =--Rarely gets dizzy), focal weakness, weakness and headaches.  Psychiatric/Behavioral: Negative for depression (Not really depression, just morning her husband) and memory loss. The patient does not have insomnia.   All other systems reviewed and are negative.  I have reviewed and (if needed) personally updated the patient's problem list, medications, allergies, past medical and surgical history, social and family history.   Past Medical History:  Diagnosis Date  . DM type 2 (diabetes mellitus, type 2) ([place])   . Dyslipidemia   . Heart palpitations  No documented Arrhythmia  . HTN (hypertension), benign    Prior History of Presumed Hypertensive Cardiomyopathy with LVH - not confrimed by Echo in 2010 & 2015    Past Surgical History:  Procedure Laterality Date  . TRANSTHORACIC ECHOCARDIOGRAM  January 2015, August 2018   a) 2015 - NormalLV size & function, EF 60-65% with Gr 1 DD, mild Ao Sclerosis with mild-mod AI;; b) no significant change    Current Meds  Medication Sig  . amLODipine (NORVASC) 5 MG tablet Take 1  tablet (5 mg total) by mouth daily. Please call to make appointment for further refills  . aspirin EC 81 MG tablet Take 81 mg by mouth as needed.  . Cholecalciferol (VITAMIN D3) 2000 UNITS capsule Take 5,000 Units by mouth daily.   . Coenzyme Q10 (CO Q 10) 100 MG CAPS Take 300 mg by mouth daily.   . CRESTOR 5 MG tablet Take a 5 mg tabletby mouth, one tablet five days a week  . JARDIANCE 25 MG TABS tablet Take 12.5 mg by mouth 2 (two) times daily.   . metFORMIN (GLUCOPHAGE) 500 MG tablet Take 1,000 mg by mouth 2 (two) times daily.  . Multiple Vitamin (MULTIVITAMIN) tablet Take 1 tablet by mouth daily.  . Omega-3 Fatty Acids (EMULSIFIED OMEGA-3) 315-1761 MG/15ML LIQD Takes 2 teaspoons by mouth daily.  . [DISCONTINUED] BYSTOLIC 10 MG tablet TAKE 1 TABLET BY MOUTH EVERY DAY    Allergies  Allergen Reactions  . Corn-Containing Products Anaphylaxis  . Ibuprofen Shortness Of Breath  . Peanuts [Peanut Oil] Anaphylaxis  . Penicillins Shortness Of Breath  . Shellfish Allergy Anaphylaxis  . Soy Allergy Anaphylaxis    Only sometimes    Social History   Tobacco Use  . Smoking status: Never Smoker  . Smokeless tobacco: Never Used  Substance Use Topics  . Alcohol use: No  . Drug use: No   Social History   Social History Narrative   Recently widowed (husband of 69 yrs - Engineer, manufacturing systems, died 09/24/18)   Mother of 2.   He does not smoke or drink alcohol.   She tries to exercise, has been not as compliant as she had in the past.       family history includes Arrhythmia in her brother; Diabetes in her brother and brother.  Wt Readings from Last 3 Encounters:  10/21/18 139 lb 9.6 oz (63.3 kg)  06/03/18 144 lb 9.6 oz (65.6 kg)  06/29/17 143 lb 9.6 oz (65.1 kg)    PHYSICAL EXAM BP (!) 162/80   Pulse 77   Ht 5\' 4"  (1.626 m)   Wt 139 lb 9.6 oz (63.3 kg)   BMI 23.96 kg/m  Physical Exam  Constitutional: She is oriented to person, place, and time. She appears well-developed and well-nourished.  No distress.  HENT:  Head: Normocephalic and atraumatic.  Neck: Normal range of motion. Neck supple. No hepatojugular reflux and no JVD present. Carotid bruit is not present. No thyromegaly present.  Cardiovascular: Normal rate and intact distal pulses.  Occasional extrasystoles are present. PMI is not displaced. Exam reveals no gallop and no friction rub.  Murmur heard.  Medium-pitched harsh crescendo-decrescendo early systolic murmur is present with a grade of 1/6 at the upper right sternal border. Pulmonary/Chest: Effort normal and breath sounds normal. No respiratory distress. She has no wheezes. She has no rales.  Musculoskeletal: Normal range of motion.        General: No edema.  Neurological: She is alert and oriented  to person, place, and time.  Psychiatric: She has a normal mood and affect. Her behavior is normal. Judgment and thought content normal.  Does seem a little bit dysthymic and sad, but not depressed.  Vitals reviewed.    Adult ECG Report  Rate: 77;  Rhythm: normal sinus rhythm and Normal axis, intervals and durations.;   Narrative Interpretation: Normal EKG   Other studies Reviewed: Additional studies/ records that were reviewed today include:  Recent Labs: July 29, 2018: TC 264, TG 177, LDL 167, HDL 36.  A1c 8.4.  creatinine 1.04.   ASSESSMENT / PLAN: Problem List Items Addressed This Visit    Dyslipidemia associated with type 2 diabetes mellitus ([place]) (Chronic)    Now back at this time not really interested in being all that aggressive despite pretty poorly controlled lipids.  Can discuss at follow-up.      Heart palpitations (Chronic)    Pretty much controlled, but now with having a little bit more palpitations, increasing her Bystolic may help.  If necessary, could stay on 20 mg daily.      Relevant Orders   EKG 12-Lead (Completed)   Moderate essential hypertension - Primary (Chronic)    Blood pressure remains high today. Plan: Alternate taking 20 mg  of Bystolic and 10 mg Bystolic every other day.  We will follow-up in 3 months to reassess blood pressure, but she also will be seeing her PCP.      Relevant Orders   EKG 12-Lead (Completed)     I spent a good half an hour with Kathy [name] today.  She is quite stable from a cardiac standpoint.  She had some left arm pain that sounds quite non-anginal in nature.  I do not think we need to assess that at all.  Mostly what she needed was to discuss the events around Smitty's death, and events that happened in the emergency room.  She talked it through and talked about how she is try to come to grips with his loss and how to manage things at home. Of the 30 minutes I spent with her, 25 minutes was spent simply talking about her husband's death, and I think that this situation led to her symptoms that have caused her to come in early.   Current medicines are reviewed at length with the patient today.  (+/- concerns) none The following changes have been made:  See below  Patient Instructions  Medication Instructions:   alternate taking 10 mg one day then 20 mg the other day  BYSTOLIC  If you need a refill on your cardiac medications before your next appointment, please call your pharmacy.   Lab work: Not needed If you have labs (blood work) drawn today and your tests are completely normal, you will receive your results only by: Marland Kitchen MyChart Message (if you have MyChart) OR . A paper copy in the mail If you have any lab test that is abnormal or we need to change your treatment, we will call you to review the results.  Testing/Procedures: Not needed  Follow-Up: At [facility], you and your health needs are our priority.  As part of our continuing mission to provide you with exceptional heart care, we have created designated Provider Care Teams.  These Care Teams include your primary Cardiologist (physician) and Advanced Practice Providers (APPs -  Physician Assistants and Nurse Practitioners) who  all work together to provide you with the care you need, when you need it. You will need a follow  up appointment in 4 months.  Please call our office 2 months in advance to schedule this appointment.  You may see Glenetta Hew, MD or one of the following Advanced Practice Providers on your designated Care Team:   Rosaria Ferries, PA-C . Jory Sims, DNP, ANP  Any Other Special Instructions Will Be Listed Below (If Applicable).  \    Studies Ordered:   Orders Placed This Encounter  Procedures  . EKG 12-Lead      Glenetta Hew, M.D., M.S. Interventional Cardiologist   Pager # [phone] Phone # [phone] [address]. [place], [place] [address]   Thank you for choosing Heartcare at [facility]!!

## 2018-10-22 ENCOUNTER — Encounter: Payer: Self-pay | Admitting: Cardiology

## 2018-10-22 NOTE — Assessment & Plan Note (Signed)
Blood pressure remains high today. Plan: Alternate taking 20 mg of Bystolic and 10 mg Bystolic every other day.  We will follow-up in 3 months to reassess blood pressure, but she also will be seeing her PCP.

## 2018-10-22 NOTE — Assessment & Plan Note (Signed)
Pretty much controlled, but now with having a little bit more palpitations, increasing her Bystolic may help.  If necessary, could stay on 20 mg daily.

## 2018-10-22 NOTE — Assessment & Plan Note (Signed)
Now back at this time not really interested in being all that aggressive despite pretty poorly controlled lipids.  Can discuss at follow-up.

## 2018-12-02 DIAGNOSIS — E7800 Pure hypercholesterolemia, unspecified: Secondary | ICD-10-CM | POA: Diagnosis not present

## 2018-12-02 DIAGNOSIS — E789 Disorder of lipoprotein metabolism, unspecified: Secondary | ICD-10-CM | POA: Diagnosis not present

## 2018-12-02 DIAGNOSIS — E118 Type 2 diabetes mellitus with unspecified complications: Secondary | ICD-10-CM | POA: Diagnosis not present

## 2018-12-02 DIAGNOSIS — I10 Essential (primary) hypertension: Secondary | ICD-10-CM | POA: Diagnosis not present

## 2019-02-13 ENCOUNTER — Telehealth: Payer: Self-pay

## 2019-02-13 NOTE — Telephone Encounter (Signed)
Virtual Visit Pre-Appointment Phone Call  "(Kathy [name]), I am calling you today to discuss your upcoming appointment. We are currently trying to limit exposure to the virus that causes COVID-19 by seeing patients at home rather than in the office."  1. "What is the BEST phone number to call the day of the visit?" - [phone]  2. "Do you have or have access to (through a family member/friend) a smartphone with video capability that we can use for your visit?" a. If yes - list this number in appt notes as "cell" (if different from BEST phone #) and list the appointment type as a VIDEO visit in appointment notes  3. Confirm consent - "In the setting of the current Covid19 crisis, you are scheduled for a (phone or video) visit with your provider on (June 3 ) at (11:20 am).  Just as we do with many in-office visits, in order for you to participate in this visit, we must obtain consent.  If you'd like, I can send this to your mychart (if signed up) or email for you to review.  Otherwise, I can obtain your verbal consent now.  All virtual visits are billed to your insurance company just like a normal visit would be.  By agreeing to a virtual visit, we'd like you to understand that the technology does not allow for your provider to perform an examination, and thus may limit your provider's ability to fully assess your condition. If your provider identifies any concerns that need to be evaluated in person, we will make arrangements to do so.  Finally, though the technology is pretty good, we cannot assure that it will always work on either your or our end, and in the setting of a video visit, we may have to convert it to a phone-only visit.  In either situation, we cannot ensure that we have a secure connection.  Are you willing to proceed?" STAFF: Did the patient verbally acknowledge consent to telehealth visit? Document YES/NO here: YES  4. Advise patient to be prepared - "Two hours prior to your  appointment, go ahead and check your blood pressure, pulse, oxygen saturation, and your weight (if you have the equipment to check those) and write them all down. When your visit starts, your provider will ask you for this information. If you have an Apple Watch or Kardia device, please plan to have heart rate information ready on the day of your appointment. Please have a pen and paper handy nearby the day of the visit as well."  5. Give patient instructions for MyChart download to smartphone OR Doximity/Doxy.me as below if video visit (depending on what platform provider is using)  6. Inform patient they will receive a phone call 15 minutes prior to their appointment time (may be from unknown caller ID) so they should be prepared to answer    TELEPHONE CALL NOTE  Kathy [name] has been deemed a candidate for a follow-up tele-health visit to limit community exposure during the Covid-19 pandemic. I spoke with the patient via phone to ensure availability of phone/video source, confirm preferred email & phone number, and discuss instructions and expectations.  I reminded Kathy [name] to be prepared with any vital sign and/or heart rhythm information that could potentially be obtained via home monitoring, at the time of her visit. I reminded Kathy [name] to expect a phone call prior to her visit.  Kathy [name], [place] 02/13/2019 12:03 PM   INSTRUCTIONS FOR DOWNLOADING  THE MYCHART APP TO SMARTPHONE  - The patient must first make sure to have activated MyChart and know their login information - If Apple, go to [employer] and type in MyChart in the search bar and download the app. If Android, ask patient to go to [employer] and type in [place] in the search bar and download the app. The app is free but as with any other app downloads, their phone may require them to verify saved payment information or Apple/Android password.  - The patient will need to then log into the app with  their MyChart username and password, and select [facility] as their healthcare provider to link the account. When it is time for your visit, go to the MyChart app, find appointments, and click Begin Video Visit. Be sure to Select Allow for your device to access the Microphone and Camera for your visit. You will then be connected, and your provider will be with you shortly.  **If they have any issues connecting, or need assistance please contact MyChart service desk [phone])**  **If using a computer, in order to ensure the best quality for their visit they will need to use either of the following Internet Browsers: [employer], or Google Chrome**  IF USING DOXIMITY or DOXY.ME - The patient will receive a link just prior to their visit by text.     FULL LENGTH CONSENT FOR TELE-HEALTH VISIT   I hereby voluntarily request, consent and authorize [facility] and its employed or contracted physicians, physician assistants, nurse practitioners or other licensed health care professionals (the Practitioner), to provide me with telemedicine health care services (the "Services") as deemed necessary by the treating Practitioner. I acknowledge and consent to receive the Services by the Practitioner via telemedicine. I understand that the telemedicine visit will involve communicating with the Practitioner through live audiovisual communication technology and the disclosure of certain medical information by electronic transmission. I acknowledge that I have been given the opportunity to request an in-person assessment or other available alternative prior to the telemedicine visit and am voluntarily participating in the telemedicine visit.  I understand that I have the right to withhold or withdraw my consent to the use of telemedicine in the course of my care at any time, without affecting my right to future care or treatment, and that the Practitioner or I may terminate the telemedicine  visit at any time. I understand that I have the right to inspect all information obtained and/or recorded in the course of the telemedicine visit and may receive copies of available information for a reasonable fee.  I understand that some of the potential risks of receiving the Services via telemedicine include:  Marland Kitchen Delay or interruption in medical evaluation due to technological equipment failure or disruption; . Information transmitted may not be sufficient (e.g. poor resolution of images) to allow for appropriate medical decision making by the Practitioner; and/or  . In rare instances, security protocols could fail, causing a breach of personal health information.  Furthermore, I acknowledge that it is my responsibility to provide information about my medical history, conditions and care that is complete and accurate to the best of my ability. I acknowledge that Practitioner's advice, recommendations, and/or decision may be based on factors not within their control, such as incomplete or inaccurate data provided by me or distortions of diagnostic images or specimens that may result from electronic transmissions. I understand that the practice of medicine is not an exact science and that Practitioner  makes no warranties or guarantees regarding treatment outcomes. I acknowledge that I will receive a copy of this consent concurrently upon execution via email to the email address I last provided but may also request a printed copy by calling the office of [facility].    I understand that my insurance will be billed for this visit.   I have read or had this consent read to me. . I understand the contents of this consent, which adequately explains the benefits and risks of the Services being provided via telemedicine.  . I have been provided ample opportunity to ask questions regarding this consent and the Services and have had my questions answered to my satisfaction. . I give my informed consent for  the services to be provided through the use of telemedicine in my medical care  By participating in this telemedicine visit I agree to the above.

## 2019-02-17 ENCOUNTER — Telehealth: Payer: Self-pay | Admitting: Cardiology

## 2019-02-17 NOTE — Telephone Encounter (Signed)
Encounter not need  

## 2019-02-17 NOTE — Telephone Encounter (Signed)
Video visit/my chart/pre reg complete/consent obtained -- ttf

## 2019-02-19 ENCOUNTER — Encounter: Payer: Self-pay | Admitting: Cardiology

## 2019-02-19 ENCOUNTER — Telehealth (INDEPENDENT_AMBULATORY_CARE_PROVIDER_SITE_OTHER): Payer: Medicare Other | Admitting: Cardiology

## 2019-02-19 ENCOUNTER — Telehealth: Payer: Self-pay | Admitting: *Deleted

## 2019-02-19 VITALS — BP 175/85 | HR 71 | Ht 64.0 in | Wt 135.4 lb

## 2019-02-19 DIAGNOSIS — E1169 Type 2 diabetes mellitus with other specified complication: Secondary | ICD-10-CM

## 2019-02-19 DIAGNOSIS — E785 Hyperlipidemia, unspecified: Secondary | ICD-10-CM | POA: Diagnosis not present

## 2019-02-19 DIAGNOSIS — R002 Palpitations: Secondary | ICD-10-CM

## 2019-02-19 DIAGNOSIS — I10 Essential (primary) hypertension: Secondary | ICD-10-CM

## 2019-02-19 MED ORDER — NEBIVOLOL HCL 10 MG PO TABS: 10.0000 mg | ORAL_TABLET | Freq: Two times a day (BID) | ORAL | 3 refills | Status: DC

## 2019-02-19 MED ORDER — NEBIVOLOL HCL 10 MG PO TABS: 20.0000 mg | ORAL_TABLET | Freq: Every day | ORAL | 3 refills | Status: DC

## 2019-02-19 NOTE — Telephone Encounter (Signed)
Spoke to patient - instruction given - for tele-visit 6/3 2020- avs summary will be sent via mychart- Rx  E-sent to pharmacy. Patient request to be seen in 4 months rather than 5 t0 6 months . recall placed.  patient verbalized understanding

## 2019-02-19 NOTE — Assessment & Plan Note (Signed)
Pressures are still running quite high. Plan: Increase Bystolic to 20 mg daily.

## 2019-02-19 NOTE — Assessment & Plan Note (Signed)
Probably related to stress as they are now occurring a little more frequently with the stresses from COVID-19, riots and Smitty's death. Since her blood pressure is also a little higher, we will increase Bystolic to 20 mg daily for the duration.  Would also ask that her PCP checked an EKG when she comes in for her annual visit.

## 2019-02-19 NOTE — Patient Instructions (Addendum)
Medication Instructions:    Increase Bystolic to 20 mg daily (2 x 10 mg tabs daily)--we will adjust your prescription.  If you need a refill on your cardiac medications before your next appointment, please call your pharmacy.   Lab work: Not needed  Testing/Procedures: I have just asked that PCP checks EKG and annual follow-up  Follow-Up: At [facility], you and your health needs are our priority.  As part of our continuing mission to provide you with exceptional heart care, we have created designated Provider Care Teams.  These Care Teams include your primary Cardiologist (physician) and Advanced Practice Providers (APPs -  Physician Assistants and Nurse Practitioners) who all work together to provide you with the care you need, when you need it. . You will need a follow up appointment in  5-6 months Nov -Dec 2020 ( patient request 4 months - recall placed for Oct 2020.  Please call our office 2 months in advance to schedule this appointment.  You may see Glenetta Hew, MD or one of the following Advanced Practice Providers on your designated Care Team:   . Rosaria Ferries, PA-C . Jory Sims, DNP, ANP  Any Other Special Instructions Will Be Listed Below (If Applicable).
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
## 2019-02-19 NOTE — Assessment & Plan Note (Signed)
She is now on Crestor 10 mg daily.  Is due for her annual visit with PCP.  Is hoping to get labs checked at that time.  Will review.

## 2019-06-03 ENCOUNTER — Other Ambulatory Visit: Payer: Self-pay | Admitting: Cardiology

## 2019-06-05 ENCOUNTER — Other Ambulatory Visit: Payer: Self-pay

## 2019-06-05 NOTE — Patient Outreach (Signed)
[facility]) Care Management  06/05/2019  Kathy [name] 14-Apr-1941 [id number]   Medication Adherence call to Mrs. Kathy [name] [facility] [employer] Verify spoke with patient she is past due on Crestor 5 mg she explain she take this medication once a day but sometimes she skips taking it because of pain and cramps patient already have spoken with her doctor about it. Mrs. Kathy [name] is showing past due under [place].   [place] Management Direct Dial [phone]  Fax [phone] [email or web address]

## 2019-06-09 ENCOUNTER — Encounter: Payer: Self-pay | Admitting: Cardiology

## 2019-06-09 DIAGNOSIS — Z0000 Encounter for general adult medical examination without abnormal findings: Secondary | ICD-10-CM | POA: Diagnosis not present

## 2019-06-09 DIAGNOSIS — I10 Essential (primary) hypertension: Secondary | ICD-10-CM | POA: Diagnosis not present

## 2019-06-09 DIAGNOSIS — E118 Type 2 diabetes mellitus with unspecified complications: Secondary | ICD-10-CM | POA: Diagnosis not present

## 2019-06-09 DIAGNOSIS — E789 Disorder of lipoprotein metabolism, unspecified: Secondary | ICD-10-CM | POA: Diagnosis not present

## 2019-06-12 ENCOUNTER — Other Ambulatory Visit: Payer: Self-pay | Admitting: Internal Medicine

## 2019-06-12 DIAGNOSIS — R519 Headache, unspecified: Secondary | ICD-10-CM

## 2019-06-24 ENCOUNTER — Ambulatory Visit
Admission: RE | Admit: 2019-06-24 | Discharge: 2019-06-24 | Disposition: A | Discharge status: Home / Self Care | Payer: Medicare Other | Source: Ambulatory Visit | Attending: Internal Medicine | Admitting: Internal Medicine

## 2019-06-24 DIAGNOSIS — R519 Headache, unspecified: Secondary | ICD-10-CM

## 2019-06-26 ENCOUNTER — Encounter: Payer: Self-pay | Admitting: Cardiology

## 2019-06-26 ENCOUNTER — Telehealth (INDEPENDENT_AMBULATORY_CARE_PROVIDER_SITE_OTHER): Payer: Medicare Other | Admitting: Cardiology

## 2019-06-26 ENCOUNTER — Telehealth: Payer: Self-pay | Admitting: *Deleted

## 2019-06-26 VITALS — BP 153/72 | HR 72 | Ht 64.0 in | Wt 133.0 lb

## 2019-06-26 DIAGNOSIS — E1169 Type 2 diabetes mellitus with other specified complication: Secondary | ICD-10-CM | POA: Diagnosis not present

## 2019-06-26 DIAGNOSIS — E785 Hyperlipidemia, unspecified: Secondary | ICD-10-CM

## 2019-06-26 DIAGNOSIS — I10 Essential (primary) hypertension: Secondary | ICD-10-CM

## 2019-06-26 DIAGNOSIS — R002 Palpitations: Secondary | ICD-10-CM | POA: Diagnosis not present

## 2019-06-26 NOTE — Assessment & Plan Note (Signed)
On Crestor 10 mg a day, but has cut it down to 5 mg.  She should be due for having labs checked by PCP.  I encouraged her that it is important for her to actually go to her doctors' offices for evaluations for the purposes of things like having labs checked, rechecking blood pressure etc.  She got very upset when she went to her PCPs office the last time because there were people that were not fully wearing their masks to cover their faces.  Hopefully by the springtime, the hysteria about COVID-19 will have abated some and she will be able to come out and get her blood work checked.  I do not think that 5 mg of rosuvastatin is going to make a significant change to her LDL of 162, but she also wants to adjust her diet.. We will follow-up in 4 to 6 months.
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
## 2019-06-26 NOTE — Assessment & Plan Note (Signed)
Pretty stable with increased dose of Bystolic.  She still has some irregularity nervousness in her chest that it carries occasionally off and on, but it seems to be more related to anxiety and still grieving from her husband passing.  She clearly is anxious and almost overbearing in the worried with the COVID-19.  1 of her sons recently tested positive for COVID-19 into the 14day quarantine.  He is doing well now back to work.  Her other son who had been living in [place] and has now had to move out to [place].  All of this is just added to her anxiety.  She is even contemplating not even going out to the grocery store but ordering groceries to be delivered.  She says she goes outside and walks around her property, but she seems to be really staying home. I think a lot of this is simply anxiety.  [id number] is certainly not helping.  I encouraged her to get out visit her friends personally.  She had 1 in person visit with a friend of hers and that seemed to do wonders for her.  I really think especially with the recent loss of her husband, this social alkalization is taking a toll on her.  We will defer discussion of depression and anxiety to her PCP but I think this is was triggering her palpitations.

## 2019-06-26 NOTE — Assessment & Plan Note (Signed)
Blood pressure still high today.  Better than it was when we put her on the higher dose of Bystolic.  I suggested that we should probably consider increasing her amlodipine to 10 mg at least until the blood pressure normalizes.  Unfortunately Kathy [name] has her own agenda and wants to give it another few months to allow for her to adjust her diet.  She says that she has been eating out a lot and wants to try to go back to her own cooking.  I do suspect that there is some stress component to it.  She is still psychologically is adjusting to her husband's loss and then all the COVID-19 issues.  The plan is to reevaluate in 4-6 months.  Options would be either starting low-dose HCTZ versus increasing amlodipine.

## 2019-06-26 NOTE — Telephone Encounter (Signed)
LEFT MESSAGE TO PATIENT. INSTRUCTION WAS LEFT  FROM TODAY'S VIRTUAL VISIT.  AVS SUMMARY SET VIA MYCHART  PATIENT MAY CALL BACK WITH ANY QUESTION  .

## 2019-06-26 NOTE — Patient Instructions (Addendum)
Medication Instructions:  NO CHANGES   If you need a refill on your cardiac medications before your next appointment, please call your pharmacy.   Lab work: NOT NEEDED    Testing/Procedures: NOT NEEDED  Follow-Up: At [facility], you and your health needs are our priority.  As part of our continuing mission to provide you with exceptional heart care, we have created designated Provider Care Teams.  These Care Teams include your primary Cardiologist (physician) and Advanced Practice Providers (APPs -  Physician Assistants and Nurse Practitioners) who all work together to provide you with the care you need, when you need it. . You will need a follow up appointment in  4-6  Months FEB/APRIL 2021.  Please call our office 2 months in advance to schedule this appointment.  You may see Glenetta Hew, MD or one of the following Advanced Practice Providers on your designated Care Team:   . Rosaria Ferries, PA-C . Jory Sims, DNP, ANP  Any Other Special Instructions Will Be Listed Below .  -- Please try to make time for getting back into social activities.

## 2019-08-23 DIAGNOSIS — Z719 Counseling, unspecified: Secondary | ICD-10-CM | POA: Diagnosis not present

## 2019-09-05 DIAGNOSIS — E118 Type 2 diabetes mellitus with unspecified complications: Secondary | ICD-10-CM | POA: Diagnosis not present

## 2019-09-09 DIAGNOSIS — L659 Nonscarring hair loss, unspecified: Secondary | ICD-10-CM | POA: Diagnosis not present

## 2019-09-09 DIAGNOSIS — I10 Essential (primary) hypertension: Secondary | ICD-10-CM | POA: Diagnosis not present

## 2019-09-09 DIAGNOSIS — E7800 Pure hypercholesterolemia, unspecified: Secondary | ICD-10-CM | POA: Diagnosis not present

## 2019-09-09 DIAGNOSIS — E118 Type 2 diabetes mellitus with unspecified complications: Secondary | ICD-10-CM | POA: Diagnosis not present

## 2019-10-13 ENCOUNTER — Telehealth: Payer: Self-pay | Admitting: Cardiology

## 2019-10-13 NOTE — Telephone Encounter (Signed)
Advised patient, verbalized understanding  

## 2019-10-13 NOTE — Telephone Encounter (Signed)
Spoke with patient regarding blood pressure She takes Amlodipine 5 mg and Bystolic 20 mg both in the evening. Yesterday was a stressful day. She has been under a lot of stress, husband passed away September 23, 2018 and then pandemic came on. She ate peanut butter last night even though she does not tolerate it well. She has had a feeling of anxiety/allerigic reaction type feeling today. She did take hydroxyzine that she uses when she has allergic reaction. She does have visit with Dr Ellyn Hack tomorrow. Blood pressure on recheck was 183/72 HR 88 Will forward to Pharm D for review.

## 2019-10-13 NOTE — Telephone Encounter (Signed)
Pt c/o BP issue: STAT if pt c/o blurred vision, one-sided weakness or slurred speech  1. What are your last 5 BP readings? 183/82 and 194/82 now  2. Are you having any other symptoms (ex. Dizziness, headache, blurred vision, passed out)? Legs feel weak   3. What is your BP issue? Blood pressure running high and legs felt really weak this morning- she wanted an appt- I made her one for tomorrow(10-14-19). Please call to evaluate

## 2019-10-13 NOTE — Telephone Encounter (Signed)
Have her take her meds as prescribed.  Probably high due to anxiety and stress.  Dr. Ellyn Hack can review tomorrow

## 2019-10-14 ENCOUNTER — Other Ambulatory Visit: Payer: Self-pay

## 2019-10-14 ENCOUNTER — Ambulatory Visit (INDEPENDENT_AMBULATORY_CARE_PROVIDER_SITE_OTHER): Payer: Medicare Other | Admitting: Cardiology

## 2019-10-14 ENCOUNTER — Encounter: Payer: Self-pay | Admitting: Cardiology

## 2019-10-14 VITALS — BP 165/76 | HR 88 | Temp 96.8°F | Ht 64.0 in

## 2019-10-14 DIAGNOSIS — I10 Essential (primary) hypertension: Secondary | ICD-10-CM

## 2019-10-14 DIAGNOSIS — E785 Hyperlipidemia, unspecified: Secondary | ICD-10-CM | POA: Diagnosis not present

## 2019-10-14 DIAGNOSIS — R002 Palpitations: Secondary | ICD-10-CM | POA: Diagnosis not present

## 2019-10-14 DIAGNOSIS — E1169 Type 2 diabetes mellitus with other specified complication: Secondary | ICD-10-CM | POA: Diagnosis not present

## 2019-10-14 DIAGNOSIS — I358 Other nonrheumatic aortic valve disorders: Secondary | ICD-10-CM | POA: Diagnosis not present

## 2019-10-14 NOTE — Progress Notes (Signed)
Primary Care Provider: Jani Gravel, MD Cardiologist: Glenetta Hew, MD Electrophysiologist:   Clinic Note: Chief Complaint  Patient presents with  . Follow-up    Very anxious, but otherwise doing okay  . Palpitations    Usually better controlled, but had a panic attack yesterday.    HPI:    Kathy [name] is a 78 y.o. female with a PMH notable for difficult to control hypertension and palpitations along with DM-2 and HLD.  Kathy [name] presents today for 65-month in person follow-up as the first time she has been out of the house since the COVID-19 lockdown.  Kathy [name] has been having a very difficult time coming to grips with the loss of her husband (who is also patient mine-Smitty).  She is finally doing a little better, but has been having issues with palpitations and other symptoms come to grips with having to live alone.  This has been complicated by the isolation is him during COVID-19.  Kathy [name] was last seen via telemedicine back in October 2020.  She is doing little better.  Adjusting to life but starting to open up a little bit.  Was doing a lot of walking around the house and on the property, but has not really been out and about.  Was able to do online social interactions which has helped some.  Recent Hospitalizations: None  Reviewed  CV studies:    The following studies were reviewed today: (if available, images/films reviewed: From Epic Chart or Care Everywhere) . None:   Interval History:   Kathy [name] returns today actually doing pretty well.  For the most part her palpitations have been notably improved.  She had a pretty significant panic attack yesterday where her heart rate went up pretty fast as did her blood pressure and she still a little bit out of sorts because of it today.  This is relatively unusual for her and her blood pressures at home have been more than 120-130 mmHg range and systolics.  Her heart rate is also much better controlled  this.  She said that she is switched her cholesterol medicines and is not sure the name of it.  Maybe rosuvastatin or Livalo but she is not sure.  No real significant swelling.  No PND orthopnea.  No chest pain or pressure with exception of these refill of fleeting episodes.   CV Review of Symptoms (Summary): positive for - 1 episode of anxiety with tachycardia and headache blurred vision yesterday, but for the most part has been much better with this. negative for - chest pain, dyspnea on exertion, edema, orthopnea, paroxysmal nocturnal dyspnea, shortness of breath or Syncope/near syncope, TIA/amaurosis fugax, claudication.  The patient does not have symptoms concerning for COVID-19 infection (fever, chills, cough, or new shortness of breath).  The patient is practicing social distancing and masking.  Was not sure about what to do about the COVID-19 vaccine.   REVIEWED OF SYSTEMS   A comprehensive ROS was performed. Review of Systems  Constitutional: Negative for malaise/fatigue.  HENT: Negative for congestion and nosebleeds.   Respiratory: Negative for shortness of breath and wheezing.   Cardiovascular: Negative for leg swelling.  Gastrointestinal: Negative for blood in stool, heartburn and melena.  Genitourinary: Negative for hematuria.  Musculoskeletal: Negative for falls and joint pain.  Neurological: Negative for dizziness (Except for the episode yesterday), tingling, focal weakness and weakness.  Psychiatric/Behavioral: Positive for memory loss. The patient is nervous/anxious (Panic attack yesterday). The patient does not have insomnia (  Sleeping better now.).    I have reviewed and (if needed) personally updated the patient's problem list, medications, allergies, past medical and surgical history, social and family history.   PAST MEDICAL HISTORY   Past Medical History:  Diagnosis Date  . DM type 2 (diabetes mellitus, type 2) ([place])   . Dyslipidemia   . Heart palpitations     No documented Arrhythmia  . HTN (hypertension), benign    Prior History of Presumed Hypertensive Cardiomyopathy with LVH - not confrimed by Echo in 2010 & 2015     PAST SURGICAL HISTORY   Past Surgical History:  Procedure Laterality Date  . TRANSTHORACIC ECHOCARDIOGRAM  January 2015, August 2018   a) 2015 - NormalLV size & function, EF 60-65% with Gr 1 DD, mild Ao Sclerosis with mild-mod AI;; b) no significant change     MEDICATIONS/ALLERGIES   Current Meds  Medication Sig  . amLODipine (NORVASC) 5 MG tablet Take 1 tablet (5 mg total) by mouth daily.  . Cholecalciferol (VITAMIN D3) 2000 UNITS capsule Take 5,000 Units by mouth daily.   . Coenzyme Q10 (CO Q 10) 100 MG CAPS Take 300 mg by mouth daily.   Marland Kitchen JARDIANCE 25 MG TABS tablet Take 12.5 mg by mouth 2 (two) times daily.   . metFORMIN (GLUCOPHAGE) 500 MG tablet Take 500 mg by mouth 2 (two) times daily.   . Multiple Vitamin (MULTIVITAMIN) tablet Take 1 tablet by mouth daily.  . Nebivolol HCl (BYSTOLIC) 20 MG TABS Take 20 mg by mouth at bedtime. Take 20 mg tablet by mouth at bedtime  . Omega-3 Fatty Acids (EMULSIFIED OMEGA-3) [id number] MG/15ML LIQD Takes 1 tablespon by mouth daily.  . rosuvastatin (CRESTOR) 10 MG tablet Take 5 mg by mouth every other day.     Allergies  Allergen Reactions  . Corn-Containing Products Anaphylaxis  . Ibuprofen Shortness Of Breath  . Peanuts [Peanut Oil] Anaphylaxis  . Penicillins Shortness Of Breath  . Shellfish Allergy Anaphylaxis  . Soy Allergy Anaphylaxis    Only sometimes     SOCIAL HISTORY/FAMILY HISTORY   Social History   Tobacco Use  . Smoking status: Never Smoker  . Smokeless tobacco: Never Used  Substance Use Topics  . Alcohol use: No  . Drug use: No   Social History   Social History Narrative   She is recently widowed - her husband "Smitty" (also a patient of mine) died somewhat unexpectedly from complications of a GI bleed in December 2019)   Mother of 2.   He does  not smoke or drink alcohol.   She tries to exercise, has been not as compliant as she had in the past.       Family History family history includes Arrhythmia in her brother; Diabetes in her brother and brother.   OBJCTIVE -PE, EKG, labs   Wt Readings from Last 3 Encounters:  06/26/19 133 lb (60.3 kg)  02/19/19 135 lb 6.4 oz (61.4 kg)  10/21/18 139 lb 9.6 oz (63.3 kg)    Physical Exam: BP (!) 165/76   Pulse 88   Temp (!) 96.8 F (36 C)   Ht 5\' 4"  (1.626 m)   SpO2 99%   BMI 22.83 kg/m  Physical Exam  Constitutional: She is oriented to person, place, and time. She appears well-developed and well-nourished. No distress.  Well-groomed.  Healthy-appearing  HENT:  Head: Normocephalic and atraumatic.  Neck: No hepatojugular reflux and no JVD present. Carotid bruit is not present.  Cardiovascular: Normal rate,  regular rhythm and intact distal pulses.  No extrasystoles are present. PMI is not displaced. Exam reveals no gallop and no friction rub.  Murmur heard.  Medium-pitched harsh crescendo-decrescendo early systolic murmur is present with a grade of 1/6 at the upper right sternal border. Pulmonary/Chest: Effort normal and breath sounds normal. No respiratory distress. She has no wheezes.  Musculoskeletal:        General: No edema. Normal range of motion.     Cervical back: Normal range of motion and neck supple.  Neurological: She is alert and oriented to person, place, and time.  Psychiatric: She has a normal mood and affect. Her behavior is normal. Judgment and thought content normal.  Vitals reviewed.    Adult ECG Report  Rate: 83 ;  Rhythm: normal sinus rhythm and Normal axis, intervals and durations.  Nonspecific ST and T wave changes.;   Narrative Interpretation: Stable EKG  Recent Labs: September 05, 2019: TC 229, TG 136, HDL 54.  A1c 7.0. Cr 1.02 --> PCP changed her lipid medicine. No results found for: CHOL, HDL, LDLCALC, LDLDIRECT, TRIG, CHOLHDL No results found  for: CREATININE, BUN, NA, K, CL, CO2  ASSESSMENT/PLAN    Problem List Items Addressed This Visit    Aortic heart murmur (Chronic)    Soft aortic sclerosis murmur.  Does not seem any louder.      Moderate essential hypertension - Primary (Chronic)    Blood pressure tends to run high, but she probably has some whitecoat syndrome.  She is on amlodipine at 5 mg of Bystolic 20 mg.  She has an episode like yesterday she has an option to be to take an additional amlodipine or Bystolic.  My suspicion would be be probably better to take Bystolic because it was more of an anxiety attack. Otherwise she continues to stay on current meds.  Usually well controlled pressures.      Relevant Orders   EKG 12-Lead (Completed)   Dyslipidemia associated with type 2 diabetes mellitus (HCC) (Chronic)    Lipids are not as well-controlled as they have been.  She was having issues I guess with the statin that she was on.  I do not know if rosuvastatin is new, but she is either started Livalo or rosuvastatin.  She will call us to let us know her medication.  She is on Jardiance with no significant edema.      Heart palpitations (Chronic)    Very much stable since increased dose of Bystolic.  I think for episodes like yesterday, taking extra dose of Bystolic would probably be better than amlodipine.      Relevant Orders   EKG 12-Lead (Completed)       COVID-19 Education: The signs and symptoms of COVID-19 were discussed with the patient and how to seek care for testing (follow up with PCP or arrange E-visit).   The importance of social distancing was discussed today.  I spent a total of 22 minutes with the patient. >  50% of the time was spent in direct patient consultation.  Additional time spent with chart review  / charting (studies, outside notes, etc): 6 Total Time: 28 min   Current medicines are reviewed at length with the patient today.  (+/- concerns) none   Patient Instructions /  Medication Changes & Studies & Tests Ordered   Patient Instructions  Medication Instructions:    continue with current medications  if you have an episode like you had yesterday - yu may take  An extra Bystolic tablet    *If you need a refill on your cardiac medications before your next appointment, please call your pharmacy*  Lab Work: Not needed  Testing/Procedures: Not needed  Follow-Up: At [facility], you and your health needs are our priority.  As part of our continuing mission to provide you with exceptional heart care, we have created designated Provider Care Teams.  These Care Teams include your primary Cardiologist (physician) and Advanced Practice Providers (APPs -  Physician Assistants and Nurse Practitioners) who all work together to provide you with the care you need, when you need it.  Your next appointment:   6 month(s)  The format for your next appointment:   Either In Person or Virtual  Provider:   Glenetta Hew, MD  Other Instructions  call the office back  With the new  Medication name for cholesterol     Studies Ordered:   Orders Placed This Encounter  Procedures  . EKG 12-Lead     Glenetta Hew, M.D., M.S. Interventional Cardiologist   Pager # [phone] Phone # [phone] [address] [place], [place] [address]   Thank you for choosing Heartcare at [facility]!!

## 2019-10-14 NOTE — Patient Instructions (Signed)
Medication Instructions:    continue with current medications  if you have an episode like you had yesterday - yu may take  An extra Bystolic tablet    *If you need a refill on your cardiac medications before your next appointment, please call your pharmacy*  Lab Work: Not needed  Testing/Procedures: Not needed  Follow-Up: At [facility], you and your health needs are our priority.  As part of our continuing mission to provide you with exceptional heart care, we have created designated Provider Care Teams.  These Care Teams include your primary Cardiologist (physician) and Advanced Practice Providers (APPs -  Physician Assistants and Nurse Practitioners) who all work together to provide you with the care you need, when you need it.  Your next appointment:   6 month(s)  The format for your next appointment:   Either In Person or Virtual  Provider:   Glenetta Hew, MD  Other Instructions  call the office back  With the new  Medication name for cholesterol

## 2019-10-16 ENCOUNTER — Encounter: Payer: Self-pay | Admitting: Cardiology

## 2019-10-16 NOTE — Assessment & Plan Note (Signed)
Very much stable since increased dose of Bystolic.  I think for episodes like yesterday, taking extra dose of Bystolic would probably be better than amlodipine.

## 2019-10-16 NOTE — Assessment & Plan Note (Addendum)
Lipids are not as well-controlled as they have been.  She was having issues I guess with the statin that she was on.  I do not know if rosuvastatin is new, but she is either started Livalo or rosuvastatin.  She will call us to let us know her medication.  She is on Jardiance with no significant edema.

## 2019-10-16 NOTE — Assessment & Plan Note (Signed)
Soft aortic sclerosis murmur.  Does not seem any louder.

## 2019-10-16 NOTE — Assessment & Plan Note (Signed)
Blood pressure tends to run high, but she probably has some whitecoat syndrome.  She is on amlodipine at 5 mg of Bystolic 20 mg.  She has an episode like yesterday she has an option to be to take an additional amlodipine or Bystolic.  My suspicion would be be probably better to take Bystolic because it was more of an anxiety attack. Otherwise she continues to stay on current meds.  Usually well controlled pressures.

## 2019-12-08 DIAGNOSIS — E118 Type 2 diabetes mellitus with unspecified complications: Secondary | ICD-10-CM | POA: Diagnosis not present

## 2019-12-08 DIAGNOSIS — I10 Essential (primary) hypertension: Secondary | ICD-10-CM | POA: Diagnosis not present

## 2019-12-08 DIAGNOSIS — E7800 Pure hypercholesterolemia, unspecified: Secondary | ICD-10-CM | POA: Diagnosis not present

## 2019-12-08 DIAGNOSIS — L659 Nonscarring hair loss, unspecified: Secondary | ICD-10-CM | POA: Diagnosis not present

## 2019-12-16 DIAGNOSIS — E7800 Pure hypercholesterolemia, unspecified: Secondary | ICD-10-CM | POA: Diagnosis not present

## 2019-12-16 DIAGNOSIS — I10 Essential (primary) hypertension: Secondary | ICD-10-CM | POA: Diagnosis not present

## 2019-12-16 DIAGNOSIS — M8580 Other specified disorders of bone density and structure, unspecified site: Secondary | ICD-10-CM | POA: Diagnosis not present

## 2019-12-16 DIAGNOSIS — E118 Type 2 diabetes mellitus with unspecified complications: Secondary | ICD-10-CM | POA: Diagnosis not present

## 2020-03-10 DIAGNOSIS — H04123 Dry eye syndrome of bilateral lacrimal glands: Secondary | ICD-10-CM | POA: Diagnosis not present

## 2020-03-10 DIAGNOSIS — G245 Blepharospasm: Secondary | ICD-10-CM | POA: Diagnosis not present

## 2020-03-30 DIAGNOSIS — H0102B Squamous blepharitis left eye, upper and lower eyelids: Secondary | ICD-10-CM | POA: Diagnosis not present

## 2020-03-30 DIAGNOSIS — G245 Blepharospasm: Secondary | ICD-10-CM | POA: Diagnosis not present

## 2020-03-30 DIAGNOSIS — H0102A Squamous blepharitis right eye, upper and lower eyelids: Secondary | ICD-10-CM | POA: Diagnosis not present

## 2020-04-01 ENCOUNTER — Other Ambulatory Visit: Payer: Self-pay | Admitting: Cardiology

## 2020-04-11 IMAGING — CT CT HEAD W/O CM
1 series · 16 of 30 positions shown, 20 images · non-contrast
Comparison: None

CLINICAL DATA: Intermittent LEFT-sided headaches off and on for
several months, hypertension, type II diabetes mellitus

EXAM:
CT HEAD WITHOUT CONTRAST
TECHNIQUE: Contiguous axial images were obtained from the base of the skull
through the vertex without intravenous contrast. Sagittal and
coronal MPR images reconstructed from axial data set.

[Series 2: head w/(date) · axial · 0.47mm/px · z∈[-131,+9]mm · 16 of 32 slices shown, 20 images]
[im 2/32  brain]
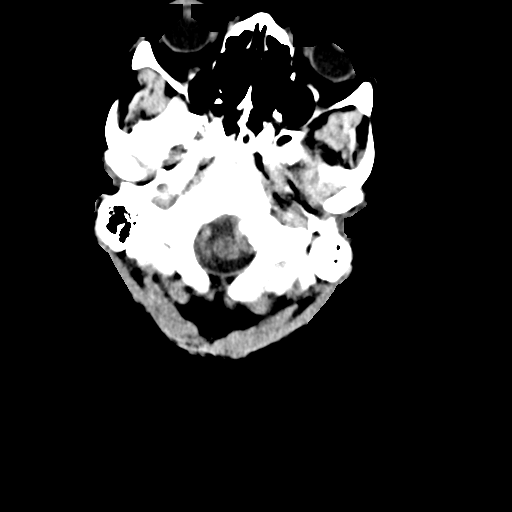
[im 2/32  bone]
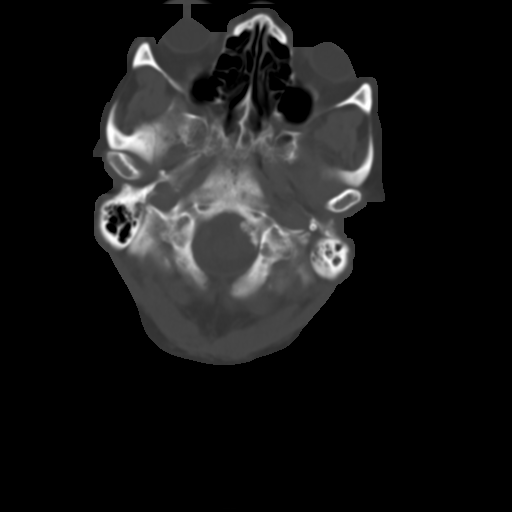
[im 4/32  brain]
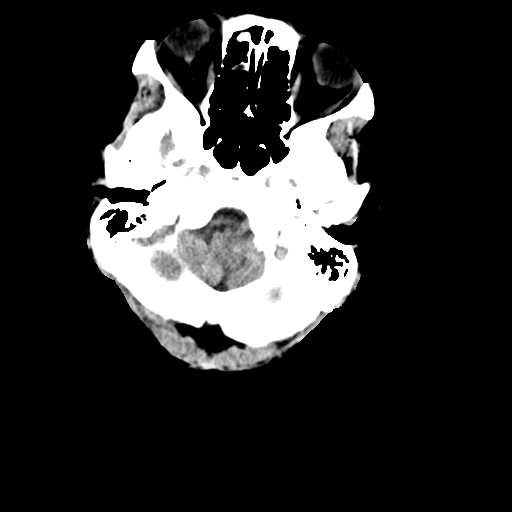
[im 6/32  brain]
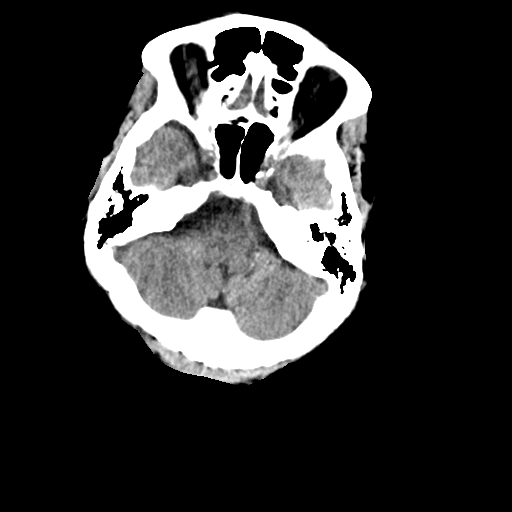
[im 8/32  brain]
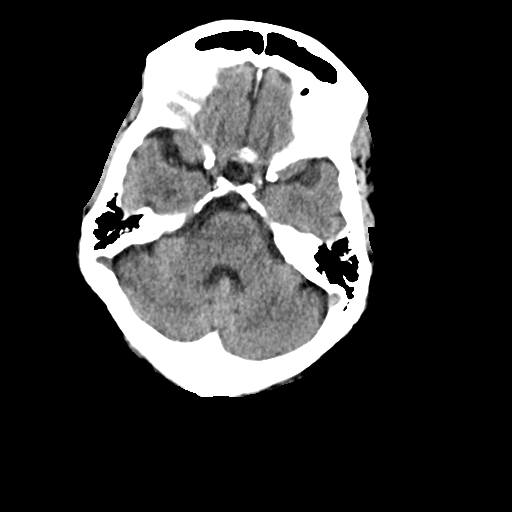
[im 9/32  brain]
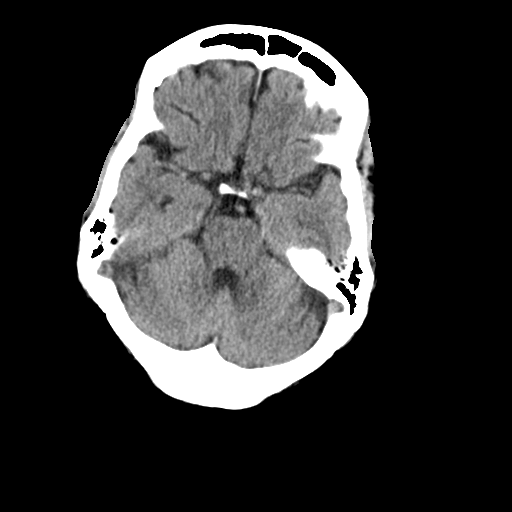
[im 9/32  bone]
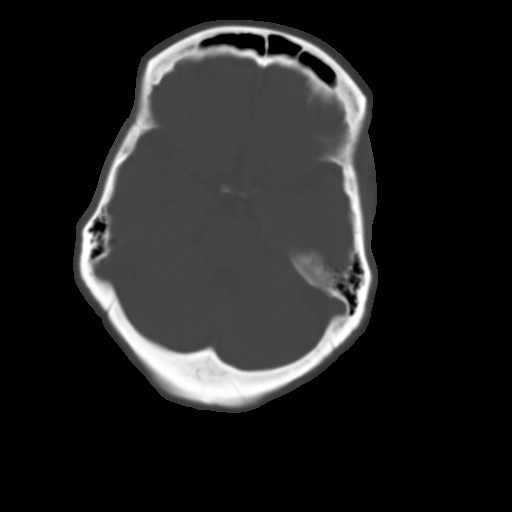
[im 11/32  brain]
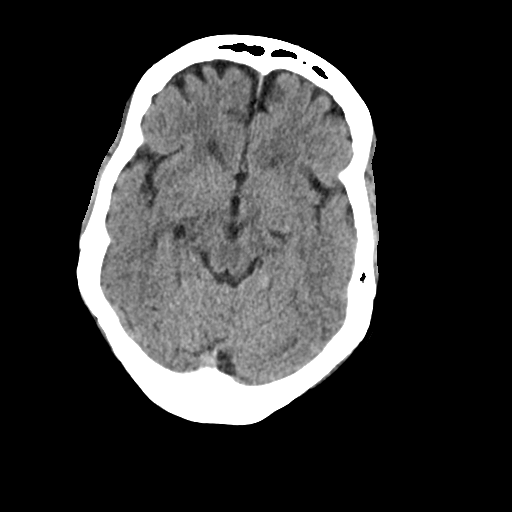
[im 13/32  brain]
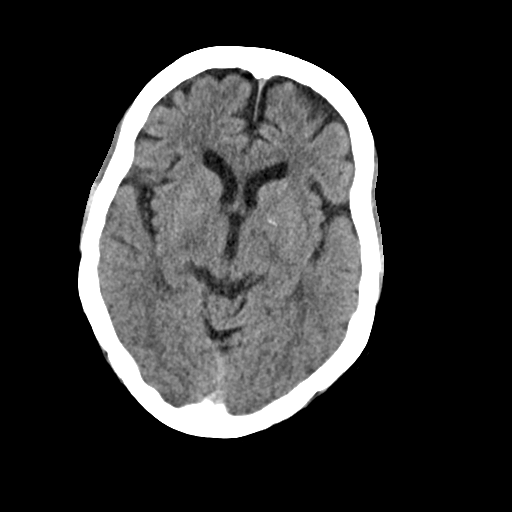
[im 15/32  brain]
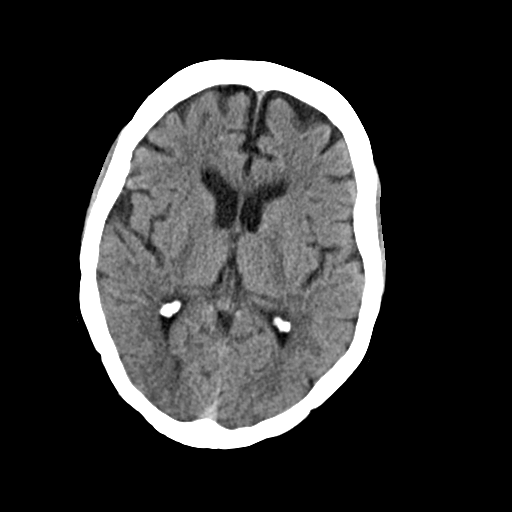
[im 17/32  brain]
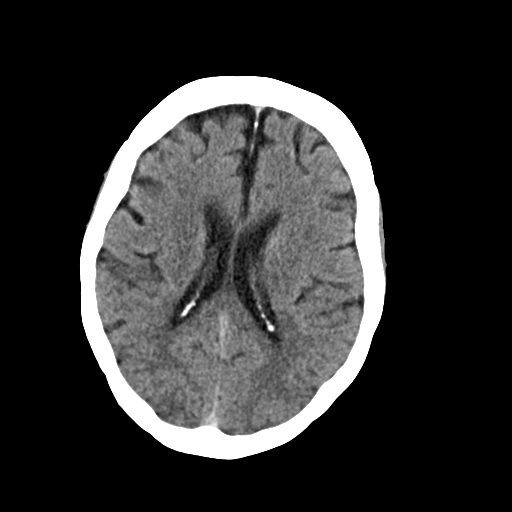
[im 17/32  bone]
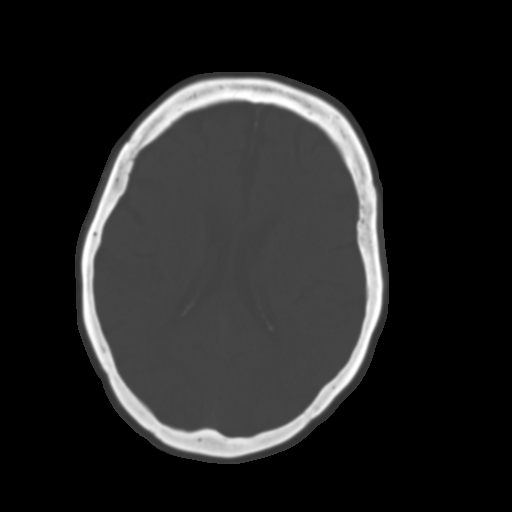
[im 19/32  brain]
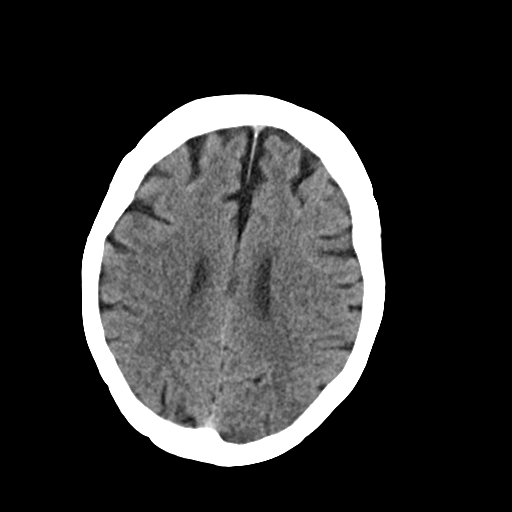
[im 21/32  brain]
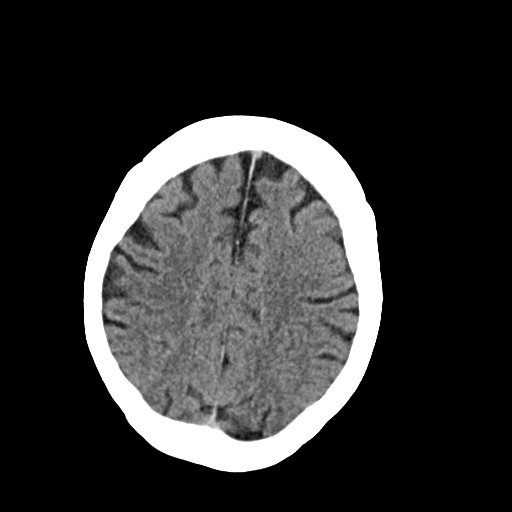
[im 23/32  brain]
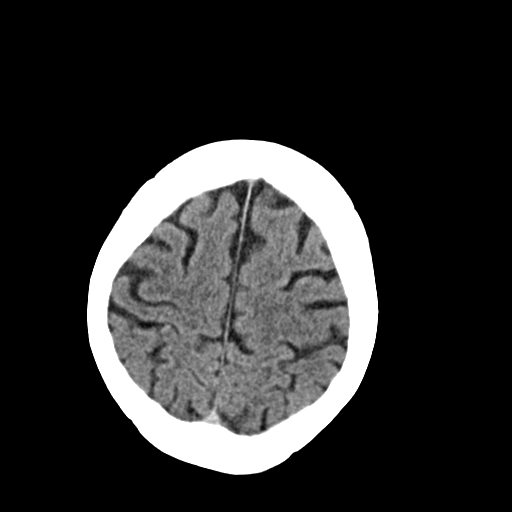
[im 24/32  brain]
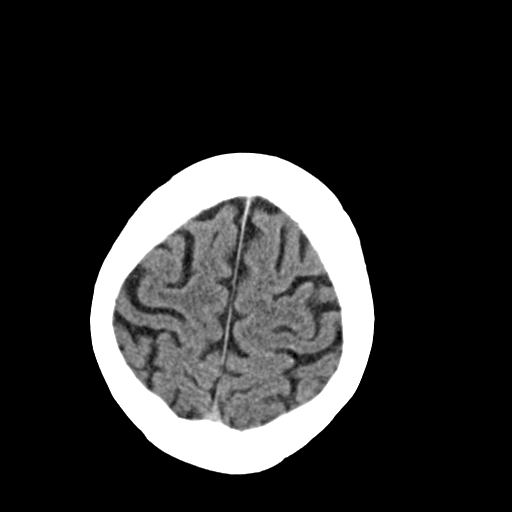
[im 24/32  bone]
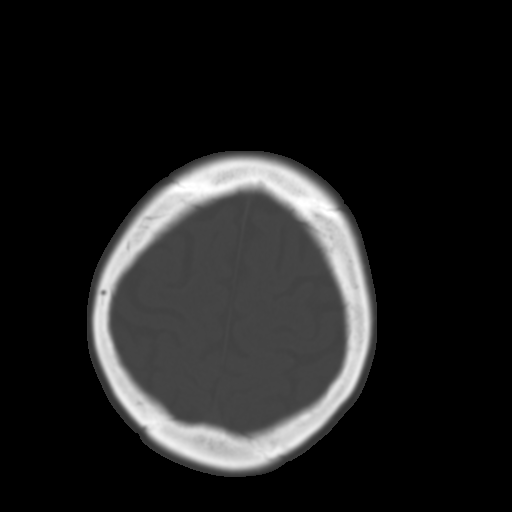
[im 26/32  brain]
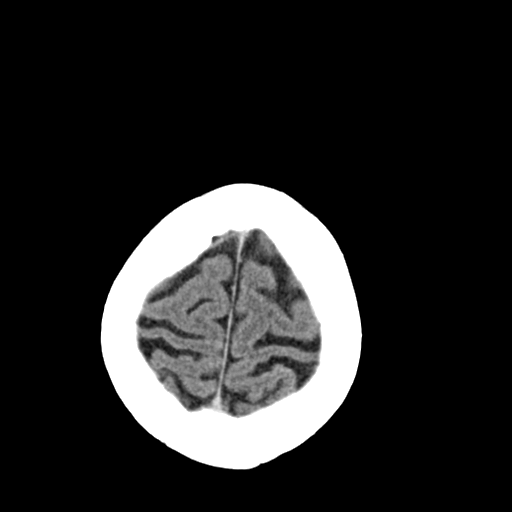
[im 28/32  brain]
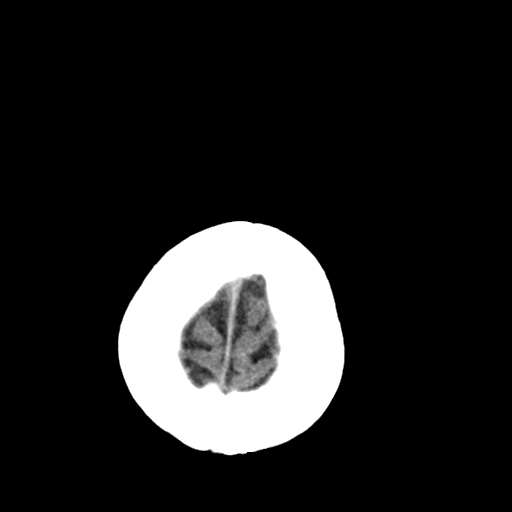
[im 30/32  brain]
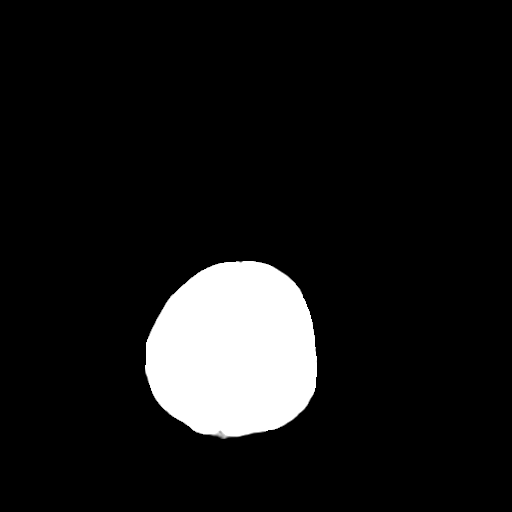

[16 of 30 positions shown; findings below may reference images not displayed]

FINDINGS: Brain: Normal ventricular morphology. Minimal atrophy. No midline
shift or mass effect. Otherwise normal appearance of brain no
intracranial hemorrhage, mass lesion, or evidence of acute
infarction. No extra-axial fluid collections.

Vascular: No hyperdense vessels

Skull: Intact

Sinuses/Orbits: Clear

Other: N/A
IMPRESSION: No acute intracranial abnormalities.

## 2020-04-16 DIAGNOSIS — Z1329 Encounter for screening for other suspected endocrine disorder: Secondary | ICD-10-CM | POA: Diagnosis not present

## 2020-04-16 DIAGNOSIS — E118 Type 2 diabetes mellitus with unspecified complications: Secondary | ICD-10-CM | POA: Diagnosis not present

## 2020-04-16 DIAGNOSIS — E7800 Pure hypercholesterolemia, unspecified: Secondary | ICD-10-CM | POA: Diagnosis not present

## 2020-04-16 DIAGNOSIS — I10 Essential (primary) hypertension: Secondary | ICD-10-CM | POA: Diagnosis not present

## 2020-04-16 DIAGNOSIS — M8580 Other specified disorders of bone density and structure, unspecified site: Secondary | ICD-10-CM | POA: Diagnosis not present

## 2020-04-29 ENCOUNTER — Encounter: Payer: Self-pay | Admitting: Cardiology

## 2020-04-29 ENCOUNTER — Ambulatory Visit (INDEPENDENT_AMBULATORY_CARE_PROVIDER_SITE_OTHER): Payer: Medicare Other | Admitting: Cardiology

## 2020-04-29 ENCOUNTER — Other Ambulatory Visit: Payer: Self-pay

## 2020-04-29 VITALS — BP 130/100 | HR 67 | Ht 64.0 in | Wt 134.2 lb

## 2020-04-29 DIAGNOSIS — E1169 Type 2 diabetes mellitus with other specified complication: Secondary | ICD-10-CM | POA: Diagnosis not present

## 2020-04-29 DIAGNOSIS — E785 Hyperlipidemia, unspecified: Secondary | ICD-10-CM

## 2020-04-29 DIAGNOSIS — R002 Palpitations: Secondary | ICD-10-CM | POA: Diagnosis not present

## 2020-04-29 DIAGNOSIS — I358 Other nonrheumatic aortic valve disorders: Secondary | ICD-10-CM

## 2020-04-29 DIAGNOSIS — I10 Essential (primary) hypertension: Secondary | ICD-10-CM | POA: Diagnosis not present

## 2020-04-29 NOTE — Progress Notes (Signed)
Primary Care Provider: Jani Gravel, MD Cardiologist: Glenetta Hew, MD Electrophysiologist:   Clinic Note: Chief Complaint  Patient presents with  . Follow-up  . Headache    HPI:    Kathy [name] is a 79 y.o. female with a PMH notable for difficult to control hypertension and palpitations along with DM-2 and HLD.  Kathy [name] presents today for 64-month in person follow-up as the first time she has been out of the house since the COVID-19 lockdown.  Kathy [name] has been having a very difficult time coming to grips with the loss of her husband (who is also patient mine-Kathy [name]).  She is finally doing a little better, but has been having issues with palpitations and other symptoms come to grips with having to live alone.  This has been complicated by the isolation is him during COVID-19.  telemedicine back in October 2020.  She is doing little better.  Adjusting to life but starting to open up a little bit.  Was doing a lot of walking around the house and on the property, but has not really been out and about.  Was able to do online social interactions which has helped some.  STARLING Kathy [name] was last seen via  Recent Hospitalizations: None  Reviewed  CV studies:    The following studies were reviewed today: (if available, images/films reviewed: From Epic Chart or Care Everywhere) . None:   Interval History:   Kathy [name] returns today in generally good spirits.  She says that she was having off-and-on palpitations maybe once or twice a day over the summer and realized that she had started habit of drinking lots of iced tea during the day.  She started cutting down on most issues drinking and that symptom improved.  Other than that, the palpitations seem to be pretty well controlled now.  She follows her blood pressures and says they tend to go up and down but tend to be a little better than they are here today at home.  She has been under a lot of stress.  Her sister Kathy [name])  just recently died, and that about a week later a very close friend lost her husband.  So she has been dealing with a lot of loss that is almost too close to the timing of when she lost her husband.  She is finding it hard for people understand that she does not want to go to viewing and this is got her quite upset.  In the absence of these psychosocial issues, she is doing fine.  She not really noticing any chest pain or pressure.  No more panic attacks.  She says she tried taking the cholesterol medicine which ever was and was not able to tolerate so she stopped taking it.  She is now taking Zetia.  That seems to be doing okay.  CV Review of Symptoms (Summary): positive for - A lot of symptoms driven by anxiety, mostly blood pressures going up with headache, but no associated dyspnea.  Palpitations notably improved with cutting down the tea she drinks. negative for - chest pain, dyspnea on exertion, edema, loss of consciousness, orthopnea, paroxysmal nocturnal dyspnea, shortness of breath or Near syncope, TIA/amaurosis fugax, claudication   The patient DOES NOT have symptoms concerning for COVID-19 infection (fever, chills, cough, or new shortness of breath).  The patient is practicing social distancing and masking.  Was not sure about what to do about the COVID-19 vaccine. Immunization History  Administered Date(s) Administered  .  PFIZER SARS-COV-2 Vaccination 01/19/2020, 02/10/2020    REVIEWED OF SYSTEMS   A comprehensive ROS was performed. Review of Systems  Constitutional: Negative for malaise/fatigue and weight loss.  HENT: Negative for nosebleeds.   Respiratory: Negative for cough.   Gastrointestinal: Negative for blood in stool and melena.  Genitourinary: Negative for hematuria.  Musculoskeletal: Negative for falls and myalgias.  Neurological: Positive for headaches (She had a headache last couple days but has been under a lot of stress). Negative for dizziness (Except for the episode  yesterday), tingling, focal weakness and weakness.  Psychiatric/Behavioral: Positive for memory loss. Negative for depression (Not really depressed, just down). The patient is nervous/anxious (No more panic episodes). The patient does not have insomnia (Sleeping better now.).        Lots of social stress and close friends/family recently dying bringing back waves of upset from when she lost her husband   I have reviewed and (if needed) personally updated the patient's problem list, medications, allergies, past medical and surgical history, social and family history.   PAST MEDICAL HISTORY   Past Medical History:  Diagnosis Date  . DM type 2 (diabetes mellitus, type 2) ([place])   . Dyslipidemia   . Heart palpitations    No documented Arrhythmia  . HTN (hypertension), benign    Prior History of Presumed Hypertensive Cardiomyopathy with LVH - not confrimed by Echo in 2010 & 2015     PAST SURGICAL HISTORY   Past Surgical History:  Procedure Laterality Date  . TRANSTHORACIC ECHOCARDIOGRAM  January 2015, August 2018   a) 2015 - NormalLV size & function, EF 60-65% with Gr 1 DD, mild Ao Sclerosis with mild-mod AI;; b) no significant change    MEDICATIONS/ALLERGIES   Current Meds  Medication Sig  . amLODipine (NORVASC) 5 MG tablet Take 1 tablet (5 mg total) by mouth daily.  Marland Kitchen BYSTOLIC 20 MG TABS TAKE 1 TABLET BY MOUTH EVERY DAY  . Cholecalciferol (VITAMIN D3) 2000 UNITS capsule Take 5,000 Units by mouth daily.   . Coenzyme Q10 (CO Q 10) 100 MG CAPS Take 300 mg by mouth daily.   Marland Kitchen ezetimibe (ZETIA) 10 MG tablet Take 10 mg by mouth daily.  Marland Kitchen JARDIANCE 25 MG TABS tablet Take 12.5 mg by mouth 2 (two) times daily.   . metFORMIN (GLUCOPHAGE) 500 MG tablet Take 500 mg by mouth 2 (two) times daily.   . Multiple Vitamin (MULTIVITAMIN) tablet Take 1 tablet by mouth daily.  . Omega-3 Fatty Acids (EMULSIFIED OMEGA-3) 568-1275 MG/15ML LIQD Takes 1 tablespon by mouth daily.  . [DISCONTINUED]  rosuvastatin (CRESTOR) 10 MG tablet Take 5 mg by mouth every other day.     Allergies  Allergen Reactions  . Corn-Containing Products Anaphylaxis  . Ibuprofen Shortness Of Breath  . Peanuts [Peanut Oil] Anaphylaxis  . Penicillins Shortness Of Breath  . Shellfish Allergy Anaphylaxis  . Soy Allergy Anaphylaxis    Only sometimes    SOCIAL HISTORY/FAMILY HISTORY   Social History   Tobacco Use  . Smoking status: Never Smoker  . Smokeless tobacco: Never Used  Substance Use Topics  . Alcohol use: No  . Drug use: No   Social History   Social History Narrative   She is recently widowed - her husband "Kathy [name]" (also a patient of mine) died somewhat unexpectedly from complications of a GI bleed in December 2019)   Mother of 2.   He does not smoke or drink alcohol.   She tries to exercise, has been  not as compliant as she had in the past.       Family History family history includes Arrhythmia in her brother; Diabetes in her brother and brother.   OBJCTIVE -PE, EKG, labs   Wt Readings from Last 3 Encounters:  04/29/20 134 lb 3.2 oz (60.9 kg)  06/26/19 133 lb (60.3 kg)  02/19/19 135 lb 6.4 oz (61.4 kg)    Physical Exam: BP (!) 130/100 (BP Location: Left Arm, Patient Position: Sitting, Cuff Size: Normal)   Pulse 67   Ht 5\' 4"  (1.626 m)   Wt 134 lb 3.2 oz (60.9 kg)   BMI 23.04 kg/m  Physical Exam Vitals reviewed.  Constitutional:      General: She is not in acute distress.    Appearance: Normal appearance. She is well-developed and normal weight. She is not ill-appearing.     Comments: Well-groomed.  Healthy-appearing  HENT:     Head: Normocephalic and atraumatic.  Neck:     Vascular: Normal carotid pulses. No carotid bruit, hepatojugular reflux or JVD.  Cardiovascular:     Rate and Rhythm: Normal rate and regular rhythm.  No extrasystoles are present.    Chest Wall: PMI is not displaced.     Pulses: Normal pulses and intact distal pulses.     Heart sounds: S1  normal and S2 normal. Murmur (1/6 C-D SEM at RUSB.) heard.  Systolic murmur is present.  No friction rub. No gallop.   Pulmonary:     Effort: Pulmonary effort is normal. No respiratory distress.     Breath sounds: Normal breath sounds. No wheezing.  Musculoskeletal:        General: No swelling. Normal range of motion.     Cervical back: Normal range of motion and neck supple.  Neurological:     General: No focal deficit present.     Mental Status: She is alert and oriented to person, place, and time.  Psychiatric:        Mood and Affect: Mood normal.        Behavior: Behavior normal.        Thought Content: Thought content normal.        Judgment: Judgment normal.      Adult ECG Report n/a  Recent Labs: Lipids were checked on April 16 2020: TC 197, TG 130, HDL 54.  Unfortunately LDL not reported on TPN.  In March was 115 and that is when she was started on Zetia.  No longer taking Crestor. No results found for: CHOL, HDL, LDLCALC, LDLDIRECT, TRIG, CHOLHDL No results found for: CREATININE, BUN, NA, K, CL, CO2  ASSESSMENT/PLAN    Problem List Items Addressed This Visit    Aortic heart murmur (Chronic)    Soft aortic sclerosis murmur.  No need to recheck echo.      Moderate essential hypertension - Primary (Chronic)    Her blood pressure is okay but diastolic pressure is little high still.  She says her pressures have still been high at home but she is under a lot of anxiety. She is on a comfortable dose amlodipine and Bystolic.  She also has whitecoat syndrome type issues.  My inclination at this point with her pressure being where it is today and all this anxiety is to simply leave medications alone and reassess.  We do have room to increase amlodipine and/or add a different medication.  She is just very reluctant to try new meds.  We will follow back up again in a few months  to see how she does.        Relevant Medications   ezetimibe (ZETIA) 10 MG tablet   Dyslipidemia  associated with type 2 diabetes mellitus ([place]) (Chronic)    She now, refuses to take rosuvastatin.  She is taking Zetia.  Need to get full labs from PCPs office to know what her LDL was.  Hopefully with the other labs getting better on Zetia, the LDL will also be improved.      Relevant Medications   ezetimibe (ZETIA) 10 MG tablet   Heart palpitations (Chronic)    Stable on current dose of beta-blocker.  She also cut down her caffeine intake and this improved her little bursts over the summer.         COVID-19 Education: The signs and symptoms of COVID-19 were discussed with the patient and how to seek care for testing (follow up with PCP or arrange E-visit).   The importance of social distancing was discussed today.  I spent a total of 26 minutes with the patient- in direct patient consultation.  She was under a lot of stress today and had lots of questions that were not necessarily pertinent to ongoing issues.  She asked about needing another echocardiogram.  She discussed her concerns and stressed about Covid. Additional time spent with chart review  / charting (studies, outside notes, etc): 6 Total Time: 83min   Current medicines are reviewed at length with the patient today.  (+/- concerns) none   Patient Instructions / Medication Changes & Studies & Tests Ordered   Patient Instructions  Medication Instructions:  NO CHANGES  *If you need a refill on your cardiac medications before your next appointment, please call your pharmacy*   Lab Work: NOT NEEDED If you have labs (blood work) drawn today and your tests are completely normal, you will receive your results only by: Marland Kitchen MyChart Message (if you have MyChart) OR . A paper copy in the mail If you have any lab test that is abnormal or we need to change your treatment, we will call you to review the results.   Testing/Procedures: NOT NEEDED   Follow-Up: At [facility], you and your health needs are our priority.  As  part of our continuing mission to provide you with exceptional heart care, we have created designated Provider Care Teams.  These Care Teams include your primary Cardiologist (physician) and Advanced Practice Providers (APPs -  Physician Assistants and Nurse Practitioners) who all work together to provide you with the care you need, when you need it.  We recommend signing up for the patient portal called "MyChart".  Sign up information is provided on this After Visit Summary.  MyChart is used to connect with patients for Virtual Visits (Telemedicine).  Patients are able to view lab/test results, encounter notes, upcoming appointments, etc.  Non-urgent messages can be sent to your provider as well.   To learn more about what you can do with MyChart, go to [email or web address].    Your next appointment:   4 month(s) Dec 2021  The format for your next appointment:   In Person  Provider:   Glenetta Hew, MD   Other Instructions  CONTINUE  WITH  TRYING TO DECREASE YOUR STRESS    Studies Ordered:   No orders of the defined types were placed in this encounter.    Glenetta Hew, M.D., M.S. Interventional Cardiologist   Pager # [phone] Phone # [phone] [address] [place], [place] [address]  Thank you for choosing Heartcare at [facility]!!

## 2020-04-29 NOTE — Patient Instructions (Addendum)
Medication Instructions:  NO CHANGES  *If you need a refill on your cardiac medications before your next appointment, please call your pharmacy*   Lab Work: NOT NEEDED If you have labs (blood work) drawn today and your tests are completely normal, you will receive your results only by: Marland Kitchen MyChart Message (if you have MyChart) OR . A paper copy in the mail If you have any lab test that is abnormal or we need to change your treatment, we will call you to review the results.   Testing/Procedures: NOT NEEDED   Follow-Up: At [facility], you and your health needs are our priority.  As part of our continuing mission to provide you with exceptional heart care, we have created designated Provider Care Teams.  These Care Teams include your primary Cardiologist (physician) and Advanced Practice Providers (APPs -  Physician Assistants and Nurse Practitioners) who all work together to provide you with the care you need, when you need it.  We recommend signing up for the patient portal called "MyChart".  Sign up information is provided on this After Visit Summary.  MyChart is used to connect with patients for Virtual Visits (Telemedicine).  Patients are able to view lab/test results, encounter notes, upcoming appointments, etc.  Non-urgent messages can be sent to your provider as well.   To learn more about what you can do with MyChart, go to [email or web address].    Your next appointment:   4 month(s) Dec 2021  The format for your next appointment:   In Person  Provider:   Glenetta Hew, MD   Other Instructions  Kathy [name]

## 2020-05-04 ENCOUNTER — Encounter: Payer: Self-pay | Admitting: Cardiology

## 2020-05-04 NOTE — Assessment & Plan Note (Signed)
Stable on current dose of beta-blocker.  She also cut down her caffeine intake and this improved her little bursts over the summer.

## 2020-05-04 NOTE — Assessment & Plan Note (Signed)
Soft aortic sclerosis murmur.  No need to recheck echo.

## 2020-05-04 NOTE — Assessment & Plan Note (Addendum)
Her blood pressure is okay but diastolic pressure is little high still.  She says her pressures have still been high at home but she is under a lot of anxiety. She is on a comfortable dose amlodipine and Bystolic.  She also has whitecoat syndrome type issues.  My inclination at this point with her pressure being where it is today and all this anxiety is to simply leave medications alone and reassess.  We do have room to increase amlodipine and/or add a different medication.  She is just very reluctant to try new meds.  We will follow back up again in a few months to see how she does.

## 2020-05-04 NOTE — Assessment & Plan Note (Signed)
She now, refuses to take rosuvastatin.  She is taking Zetia.  Need to get full labs from PCPs office to know what her LDL was.  Hopefully with the other labs getting better on Zetia, the LDL will also be improved.

## 2020-05-17 ENCOUNTER — Other Ambulatory Visit: Payer: Self-pay | Admitting: Cardiology

## 2020-05-20 DIAGNOSIS — G245 Blepharospasm: Secondary | ICD-10-CM | POA: Diagnosis not present

## 2020-05-20 DIAGNOSIS — R42 Dizziness and giddiness: Secondary | ICD-10-CM | POA: Diagnosis not present

## 2020-05-20 DIAGNOSIS — R0989 Other specified symptoms and signs involving the circulatory and respiratory systems: Secondary | ICD-10-CM | POA: Diagnosis not present

## 2020-06-29 DIAGNOSIS — H0102A Squamous blepharitis right eye, upper and lower eyelids: Secondary | ICD-10-CM | POA: Diagnosis not present

## 2020-06-29 DIAGNOSIS — G245 Blepharospasm: Secondary | ICD-10-CM | POA: Diagnosis not present

## 2020-06-29 DIAGNOSIS — H0102B Squamous blepharitis left eye, upper and lower eyelids: Secondary | ICD-10-CM | POA: Diagnosis not present

## 2020-08-10 DIAGNOSIS — I10 Essential (primary) hypertension: Secondary | ICD-10-CM | POA: Diagnosis not present

## 2020-08-10 DIAGNOSIS — Z0000 Encounter for general adult medical examination without abnormal findings: Secondary | ICD-10-CM | POA: Diagnosis not present

## 2020-08-10 DIAGNOSIS — E7800 Pure hypercholesterolemia, unspecified: Secondary | ICD-10-CM | POA: Diagnosis not present

## 2020-08-10 DIAGNOSIS — E119 Type 2 diabetes mellitus without complications: Secondary | ICD-10-CM | POA: Diagnosis not present

## 2020-08-31 ENCOUNTER — Ambulatory Visit (INDEPENDENT_AMBULATORY_CARE_PROVIDER_SITE_OTHER): Payer: Medicare Other | Admitting: Cardiology

## 2020-08-31 ENCOUNTER — Encounter: Payer: Self-pay | Admitting: Cardiology

## 2020-08-31 ENCOUNTER — Other Ambulatory Visit: Payer: Self-pay

## 2020-08-31 VITALS — BP 132/84 | HR 74 | Ht 64.0 in | Wt 135.2 lb

## 2020-08-31 DIAGNOSIS — E1169 Type 2 diabetes mellitus with other specified complication: Secondary | ICD-10-CM | POA: Diagnosis not present

## 2020-08-31 DIAGNOSIS — R739 Hyperglycemia, unspecified: Secondary | ICD-10-CM | POA: Diagnosis not present

## 2020-08-31 DIAGNOSIS — E785 Hyperlipidemia, unspecified: Secondary | ICD-10-CM | POA: Diagnosis not present

## 2020-08-31 DIAGNOSIS — I10 Essential (primary) hypertension: Secondary | ICD-10-CM | POA: Diagnosis not present

## 2020-08-31 DIAGNOSIS — R002 Palpitations: Secondary | ICD-10-CM

## 2020-08-31 NOTE — Progress Notes (Signed)
Primary Care Provider: Jani Gravel, MD Cardiologist: Glenetta Hew, MD Electrophysiologist: None  Clinic Note: Chief Complaint  Patient presents with  . Follow-up  . Hypertension  . Palpitations    Stable   Problem List Items Addressed This Visit    Moderate essential hypertension - Primary (Chronic)   Dyslipidemia associated with type 2 diabetes mellitus (HCC) (Chronic)     Vertigo   Heart palpitations (Chronic)     HPI:    Kathy [name] is a 79 y.o. female with a PMH notable for difficulties with HYPERTENSION and PALPITATIONS along with DM-2 and HLD who presents today for 10-month follow-up (original plan was 1 year follow-up).  Kathy [name] was last seen on 04/29/2020 -> was in good spirits.  Off-and-on palpitations Vascepa times a day over the summer probably related to drinking ice tea.  Better with reduced caffeine intake.  Blood pressures go up and down, but were worse related to the death of her sister (to close in time into the loss of her husband) she did not tolerate taking statin.  Only on Zetia.  Recent Hospitalizations: None  Reviewed  CV studies:    The following studies were reviewed today: (if available, images/films reviewed: From Epic Chart or Care Everywhere) . N/A:   Interval History:   Kathy [name] returns here today for 12-month follow-up doing fine.  She is still quarantining herself to her home and is really doing anything outside the house.  She is making do with being alone.  Prefers to be that way.  Has not seen her family in over 2 years with exception of during her sister's death.  She does get outside in the yard and is doing more gardening and planting etc.  She she has been Crestor before and stopped that because of myalgias.  CV Review of Symptoms (Summary): no chest pain or dyspnea on exertion positive for - Off-and-on fluttering palpitation symptoms, that are short-lived.  No associated lightheadedness or  dizziness. negative for - edema, orthopnea, paroxysmal nocturnal dyspnea, rapid heart rate, shortness of breath or Syncope/near syncope or TIA/amaurosis fugax calcification  The patient does not have symptoms concerning for COVID-19 infection (fever, chills, cough, or new shortness of breath).   REVIEWED OF SYSTEMS   ROS   Negative for falls and myalgias.  Neurological: Positive for headaches (She had a headache last couple days but has been under a lot of stress). Negative for dizziness (Except for the episode yesterday), tingling, focal weakness and weakness.  Psychiatric/Behavioral: Positive for memory loss. Negative for depression (Not really depressed, just down). The patient is nervous/anxious (No more panic episodes). The patient does not have insomnia (Sleeping better now.).        Lots of social stress and close friends/family recently dying bringing back waves of upset from when she lost her husband    I have reviewed and (if needed) personally updated the patient's problem list, medications, allergies, past medical and surgical history, social and family history.   PAST MEDICAL HISTORY   Past Medical History:  Diagnosis Date  . DM type 2 (diabetes mellitus, type 2) ([place])   . Dyslipidemia   . Heart palpitations    No documented Arrhythmia  . HTN (hypertension), benign    Prior History of Presumed Hypertensive Cardiomyopathy with LVH - not confrimed by Echo in 2010 & 2015    PAST SURGICAL HISTORY   Past Surgical History:  Procedure Laterality Date  . TRANSTHORACIC ECHOCARDIOGRAM  January 2015, August 2018   a) 2015 - NormalLV size & function, EF 60-65% with Gr 1 DD, mild Ao Sclerosis with mild-mod AI;; b) no significant change    Immunization History  Administered Date(s) Administered  . PFIZER SARS-COV-2 Vaccination 01/19/2020, 02/10/2020, 08/20/2020    MEDICATIONS/ALLERGIES   Current Meds  Medication Sig  . amLODipine (NORVASC) 5 MG tablet TAKE 1 TABLET BY  MOUTH EVERY DAY  . BYSTOLIC 20 MG TABS TAKE 1 TABLET BY MOUTH EVERY DAY  . Cholecalciferol (VITAMIN D3) 2000 UNITS capsule Take 5,000 Units by mouth daily.   . Coenzyme Q10 (CO Q 10) 100 MG CAPS Take 300 mg by mouth daily.   Marland Kitchen ezetimibe (ZETIA) 10 MG tablet Take 10 mg by mouth daily.  Marland Kitchen JARDIANCE 25 MG TABS tablet Take 12.5 mg by mouth 2 (two) times daily.   . metFORMIN (GLUCOPHAGE) 500 MG tablet Take 500 mg by mouth 2 (two) times daily.   . Multiple Vitamin (MULTIVITAMIN) tablet Take 1 tablet by mouth daily.  . Omega-3 Fatty Acids (EMULSIFIED OMEGA-3) 564-3329 MG/15ML LIQD Takes 1 tablespon by mouth daily.    Allergies  Allergen Reactions  . Corn-Containing Products Anaphylaxis  . Ibuprofen Shortness Of Breath  . Peanuts [Peanut Oil] Anaphylaxis  . Penicillins Shortness Of Breath  . Shellfish Allergy Anaphylaxis  . Soy Allergy Anaphylaxis    Only sometimes    SOCIAL HISTORY/FAMILY HISTORY   Reviewed in Epic:  Pertinent findings: ~2 yr out from husband's death.   OBJCTIVE -PE, EKG, labs   Wt Readings from Last 3 Encounters:  08/31/20 135 lb 3.2 oz (61.3 kg)  04/29/20 134 lb 3.2 oz (60.9 kg)  06/26/19 133 lb (60.3 kg)    Physical Exam: BP 132/84   Pulse 74   Ht 5\' 4"  (1.626 m)   Wt 135 lb 3.2 oz (61.3 kg)   SpO2 98%   BMI 23.21 kg/m  Physical Exam Vitals reviewed.  Constitutional:      General: She is not in acute distress.    Appearance: Normal appearance. She is normal weight. She is not ill-appearing or toxic-appearing.  HENT:     Head: Normocephalic and atraumatic.  Neck:     Vascular: No carotid bruit, hepatojugular reflux or JVD.  Cardiovascular:     Rate and Rhythm: Normal rate and regular rhythm.  No extrasystoles are present.    Chest Wall: PMI is not displaced.     Pulses: Intact distal pulses.     Heart sounds: S1 normal and S2 normal. Murmur (1/6C-D SEM at RUSB) heard.  No friction rub. No gallop.   Pulmonary:     Effort: Pulmonary effort is  normal. No respiratory distress.     Breath sounds: Normal breath sounds.  Chest:     Chest wall: No tenderness.  Musculoskeletal:        General: No swelling. Normal range of motion.     Cervical back: Normal range of motion and neck supple.  Neurological:     General: No focal deficit present.     Mental Status: She is alert and oriented to person, place, and time.  Psychiatric:        Mood and Affect: Mood normal.        Behavior: Behavior normal.        Thought Content: Thought content normal.        Judgment: Judgment normal.     Adult ECG Report  Rate: 74 ;  Rhythm: normal sinus rhythm  and ~ LVH. Non-specific ST-T wave changes;; normal axis, intervals & durations  Narrative Interpretation: stable   Recent Labs:    08/10/2020: TC 189, TG 92, HDL 49, LDL 123.  A1c 7.3. Cr 1.29, K+ 3.8.  TSH 1.75. No results found for: CHOL, HDL, LDLCALC, LDLDIRECT, TRIG, CHOLHDL No results found for: CREATININE, BUN, NA, K, CL, CO2 No results found for: TSH  ASSESSMENT/PLAN    Problem List Items Addressed This Visit    Moderate essential hypertension - Primary (Chronic)    Blood pressure looks good today.  Borderline, but stable.  She is on amlodipine 5 mg and Bystolic 20 mg (titrated up because of palpitations).      Relevant Medications   atorvastatin (LIPITOR) 10 MG tablet   Other Relevant Orders   EKG 12-Lead (Completed)   Dyslipidemia associated with type 2 diabetes mellitus (HCC) (Chronic)    Target LDL should be less than 100 with diabetes.  She refuses simvastatin but is taking Zetia. Most recent LDL was 123 and therefore inadequately controlled.  Plan: Continue Zetia, will start atorvastatin at 10 mg daily. ->  Recheck lipids in 3 months. She was actually started on Metformin and Jardiance.  (Unable to use pravastatin because of component hypersensitivity)      Relevant Medications   atorvastatin (LIPITOR) 10 MG tablet   Other Relevant Orders   Lipid panel    Comprehensive metabolic panel   Heart palpitations (Chronic)    Pretty well controlled on current dose of Bystolic.  As long as she avoids triggers like excessive caffeine and sugar, she is usually okay.Marland Kitchen  No prolonged palpitations to suggest an arrhythmia.      Relevant Orders   EKG 12-Lead (Completed)   RESOLVED: Hyperglycemia      COVID-19 Education: The signs and symptoms of COVID-19 were discussed with the patient and how to seek care for testing (follow up with PCP or arrange E-visit).   The importance of social distancing and COVID-19 vaccination was discussed today. 1 min The patient is practicing social distancing & Masking.   I spent a total of 33 minutes with the patient spent in direct patient consultation.  --> Despite being overall healthy without any major issues, Amando requires a lot of psychosocial support.  She was very open about her psychological standpoint revolving her husband's death, but also was speaking more about her fears with COVID-19. Additional time spent with chart review  / charting (studies, outside notes, etc): 8 Total Time: 36min   Current medicines are reviewed at length with the patient today.  (+/- concerns) n/a  This visit occurred during the SARS-CoV-2 public health emergency.  Safety protocols were in place, including screening questions prior to the visit, additional usage of staff PPE, and extensive cleaning of exam room while observing appropriate contact time as indicated for disinfecting solutions.  Notice: This dictation was prepared with Dragon dictation along with smaller phrase technology. Any transcriptional errors that result from this process are unintentional and may not be corrected upon review.  Patient Instructions / Medication Changes & Studies & Tests Ordered   Patient Instructions  Medication Instructions:  No changes *If you need a refill on your cardiac medications before your next appointment, please call your  pharmacy*   Lab Work: Lipid in 3 month  cmp If you have labs (blood work) drawn today and your tests are completely normal, you will receive your results only by: Marland Kitchen MyChart Message (if you have MyChart) OR . A paper  copy in the mail If you have any lab test that is abnormal or we need to change your treatment, we will call you to review the results.   Testing/Procedures: Not needed   Follow-Up: At [facility], you and your health needs are our priority.  As part of our continuing mission to provide you with exceptional heart care, we have created designated Provider Care Teams.  These Care Teams include your primary Cardiologist (physician) and Advanced Practice Providers (APPs -  Physician Assistants and Nurse Practitioners) who all work together to provide you with the care you need, when you need it.    Your next appointment:   6 month(s)  The format for your next appointment:   In Person  Provider:   Glenetta Hew, MD   Other Instructions     Studies Ordered:   Orders Placed This Encounter  Procedures  . Lipid panel  . Comprehensive metabolic panel  . EKG 12-Lead     Glenetta Hew, M.D., M.S. Interventional Cardiologist   Pager # [phone] Phone # [phone] [address] [place], [place] [address]   Thank you for choosing Heartcare at [facility]!!

## 2020-08-31 NOTE — Patient Instructions (Signed)
Medication Instructions:  No changes *If you need a refill on your cardiac medications before your next appointment, please call your pharmacy*   Lab Work: Lipid in 3 month  cmp If you have labs (blood work) drawn today and your tests are completely normal, you will receive your results only by:  [place] (if you have MyChart) OR  A paper copy in the mail If you have any lab test that is abnormal or we need to change your treatment, we will call you to review the results.   Testing/Procedures: Not needed   Follow-Up: At [facility], you and your health needs are our priority.  As part of our continuing mission to provide you with exceptional heart care, we have created designated Provider Care Teams.  These Care Teams include your primary Cardiologist (physician) and Advanced Practice Providers (APPs -  Physician Assistants and Nurse Practitioners) who all work together to provide you with the care you need, when you need it.    Your next appointment:   6 month(s)  The format for your next appointment:   In Person  Provider:   Glenetta Hew, MD   Other Instructions

## 2020-09-01 ENCOUNTER — Telehealth: Payer: Self-pay

## 2020-09-01 MED ORDER — ATORVASTATIN CALCIUM 10 MG PO TABS: ORAL_TABLET | ORAL | 6 refills | Status: DC

## 2020-09-01 NOTE — Telephone Encounter (Signed)
Spoke to patient advised Dr.Harding has prescribed Atorvastatin 10 mg take every other day instead of Pravastatin which has a corn allergy.Advised Atorvastatin does not have a corn allergy.Advised to continue Zetia.

## 2020-09-15 ENCOUNTER — Encounter: Payer: Self-pay | Admitting: Cardiology

## 2020-09-15 NOTE — Assessment & Plan Note (Signed)
Pretty well controlled on current dose of Bystolic.  As long as she avoids triggers like excessive caffeine and sugar, she is usually okay.Marland Kitchen  No prolonged palpitations to suggest an arrhythmia.

## 2020-09-15 NOTE — Assessment & Plan Note (Addendum)
Target LDL should be less than 100 with diabetes.  She refuses simvastatin but is taking Zetia. Most recent LDL was 123 and therefore inadequately controlled.  Plan: Continue Zetia, will start atorvastatin at 10 mg daily. ->  Recheck lipids in 3 months. She was actually started on Metformin and Jardiance.  (Unable to use pravastatin because of component hypersensitivity)

## 2020-09-15 NOTE — Assessment & Plan Note (Signed)
Blood pressure looks good today.  Borderline, but stable.  She is on amlodipine 5 mg and Bystolic 20 mg (titrated up because of palpitations).

## 2020-09-15 NOTE — Assessment & Plan Note (Deleted)
She was actually started on Metformin and Jardiance.

## 2020-11-09 DIAGNOSIS — S0003XA Contusion of scalp, initial encounter: Secondary | ICD-10-CM | POA: Diagnosis not present

## 2020-11-09 DIAGNOSIS — S0990XA Unspecified injury of head, initial encounter: Secondary | ICD-10-CM | POA: Diagnosis not present

## 2020-11-15 ENCOUNTER — Telehealth: Payer: Self-pay | Admitting: Cardiology

## 2020-11-15 MED ORDER — BYSTOLIC 20 MG PO TABS: 1.0000 | ORAL_TABLET | Freq: Every day | ORAL | 1 refills | Status: DC

## 2020-11-15 NOTE — Telephone Encounter (Signed)
Patient c/o Palpitations:  High priority if patient c/o lightheadedness, shortness of breath, or chest pain  1) How long have you had palpitations/irregular HR/ Afib? Are you having the symptoms now? yes  2) Are you currently experiencing lightheadedness, SOB or CP?   3) Do you have a history of afib (atrial fibrillation) or irregular heart rhythm?   4) Have you checked your BP or HR? (document readings if available):   5) Are you experiencing any other symptoms? Patient notices that her symptoms to get worse after she bends over.  Patient is also wondering if the generic version of bystolic she is taking is not causing the palps.   Pt c/o medication issue:  1. Name of Medication: BYSTOLIC 20 MG TABS  2. How are you currently taking this medication (dosage and times per day)? As directed  3. Are you having a reaction (difficulty breathing--STAT)? palpiltations  4. What is your medication issue? Patient said once this medication went generic she started to have heart palpitations. She wanted to make sure    .

## 2020-11-15 NOTE — Telephone Encounter (Signed)
Spoke with pt, she feels that her palpitations have gotten worse. She has been under a lot of stress but feels these are coming out of nowhere. She also feels like the difference in the generic forms of bystolic she has gotten from [place] and [place] may have something to do with it as well. A new prescription for the brand bystolic sent into the pharmacy at the patients request. She also wanted to see dr harding as she wants to make sure that something else is not causing the increase in palpitations. Follow up scheduled.

## 2020-11-22 ENCOUNTER — Other Ambulatory Visit: Payer: Self-pay

## 2020-11-22 ENCOUNTER — Ambulatory Visit (INDEPENDENT_AMBULATORY_CARE_PROVIDER_SITE_OTHER): Payer: Medicare Other | Admitting: Cardiology

## 2020-11-22 ENCOUNTER — Encounter: Payer: Self-pay | Admitting: Cardiology

## 2020-11-22 VITALS — BP 150/74 | HR 78 | Ht 64.0 in | Wt 133.0 lb

## 2020-11-22 DIAGNOSIS — I358 Other nonrheumatic aortic valve disorders: Secondary | ICD-10-CM | POA: Diagnosis not present

## 2020-11-22 DIAGNOSIS — R002 Palpitations: Secondary | ICD-10-CM | POA: Diagnosis not present

## 2020-11-22 DIAGNOSIS — E1169 Type 2 diabetes mellitus with other specified complication: Secondary | ICD-10-CM | POA: Diagnosis not present

## 2020-11-22 DIAGNOSIS — E785 Hyperlipidemia, unspecified: Secondary | ICD-10-CM

## 2020-11-22 DIAGNOSIS — F5104 Psychophysiologic insomnia: Secondary | ICD-10-CM

## 2020-11-22 DIAGNOSIS — I10 Essential (primary) hypertension: Secondary | ICD-10-CM

## 2020-11-22 MED ORDER — HYDROXYZINE HCL 25 MG PO TABS: ORAL_TABLET | ORAL | 3 refills | Status: AC

## 2020-11-22 MED ORDER — AMLODIPINE BESYLATE 5 MG PO TABS: 5.0000 mg | ORAL_TABLET | Freq: Every day | ORAL | 3 refills | Status: AC

## 2020-11-22 NOTE — Patient Instructions (Signed)
Medication Instructions:   start taking Amlodipine  And Zetia in the morning time   *If you need a refill on your cardiac medications before your next appointment, please call your pharmacy*   Lab Work:  Not needed.   Testing/Procedures:  Not needed  Follow-Up: At [facility], you and your health needs are our priority.  As part of our continuing mission to provide you with exceptional heart care, we have created designated Provider Care Teams.  These Care Teams include your primary Cardiologist (physician) and Advanced Practice Providers (APPs -  Physician Assistants and Nurse Practitioners) who all work together to provide you with the care you need, when you need it.     Your next appointment:    keep appointment for June 2022  The format for your next appointment:   In Person  Provider:   Glenetta Hew, MD   Other Instructions

## 2020-11-22 NOTE — Progress Notes (Signed)
Primary Care Provider: Jani Gravel, MD Cardiologist: Glenetta Hew, MD Electrophysiologist: None  Clinic Note: Chief Complaint  Patient presents with  . Follow-up    Concerns about medications.  [facility] Kitchen Hospitalization Follow-up    Urgent care visit because of medication reaction   ===================================  ASSESSMENT/PLAN   Problem List Items Addressed This Visit    Aortic heart murmur (Chronic)    Stable.  No need to recheck echo.      Moderate essential hypertension (Chronic)    Her blood pressure is high today, but she is quite "amped up ".  Plan:  Continue Bystolic, but cannot keep it as tradename.  Continue amlodipine but switch dosing to morning with Bystolic being at night.  Ensure adequate hydration to avoid palpitations.      Relevant Medications   amLODipine (NORVASC) 5 MG tablet   Dyslipidemia associated with type 2 diabetes mellitus ([place]) (Chronic)    Not at goal.  She is willing to restart Crestor.  We will start at 5 mg every other day taking it in the evening and switching Zetia to morning.  Target LDL should be less than 100.  She did not tolerate Metformin, but remains on Jardiance.      Heart palpitations - Primary (Chronic)    Relatively well controlled with Bystolic, but she had issues with Bystolic and was not taking it.  She is now back on it.  She is also back to avoiding her triggers of caffeine and sweets.  Also recommended more aggressive hydration.      Relevant Orders   EKG 12-Lead (Completed)   Insomnia (Chronic)    She has lots of internal stresses and concerns.  I think she just needs some help her sleep.  In the past she has done well with hydroxyzine  We will prescribe her PRN hydroxyzine.        ===================================  HPI:    Kathy [name] is a 80 y.o. female with a PMH notable for Difficulty Control HYPERTENSION along with palpitations, HLD and DM-2 who presents today for Urgent Care  follow-up.  Kathy [name] was last seen on August 31, 2020 as a 40-month follow-up despite original plan for 1 year follow-up).  She was doing fine, still quarantining at home rarely going away from the house, but is starting to do some work outside in the yard.  She prefers it that way.  Has not seen her family in over 2 years with exception of come together for her sister's funeral.  No major cardiac symptoms.  Noted lots of stress headaches.  Some nervous episodes.  Very stressed about close friends of family members dying, still sad from her husband's death.  Starting to forget things. -> She indicated that she was stopping the Crestor because of myalgias..  Recent Hospitalizations:   11/09/2020: Urgent Care Evaluation (was told by PCP office to got to ER) -> "head injury without LOC "; was noting symptoms for 3 and half weeks.  Involved being hit by a plastic canister on the head.  Was concerned about possible concussion.  Wanted to be seen by PCP but was told to go to ER   Reviewed  CV studies:    The following studies were reviewed today: (if available, images/films reviewed: From Epic Chart or Care Everywhere) . None:  Interval History:   Kathy [name] is here for a walk-in visit because of multiple concerns with medicines.  All this stems around an incident that happened  back in early February. It all began roughly 4 weeks ago she was doing some cooking in the kitchen, and reached up to get something from closet above her head and a plastic canister hit her on the head causing a lump.  No real strange symptoms at that time, no loss of consciousness.  However, several of her friends were concerned and told her that she needed to be checked out for concussion.  After about a week she was noticing some symptoms of dizziness but has not had any more since then.  She contacted her PCPs office with symptoms of ringing in the ear and dizziness related to this lump on her head from being  hit by the cancer, and was told to go to the emergency room.  Dr. Theda Sers was not available.  She reluctantly went to [facility], indicating that she did not want to go to the emergency room for fear more Covid risk.  Unfortunately, she was not happy with the urgent care visit-stating they "did not do anything --i.e. did not check CT scan.  She was told she had a contusion, and to put ice on it was swollen.  Was then told to follow back up with her PCP. --fully stressed out by having to go to UC.  - went back to UC to sign release of Med Info; still hasn't heard anything else. Upset b/c no CT scan. More upset b/c concern for COVID exposure.   -> started having bad palpitations _. Partly due to cup of coffee & diet soda, 2 glasses of tea along with stress from talk to Dr. Landis Gandy (PCP). ->  This is now subsequently subsided.  She has cut back the caffeine again.  After taking these with caffeine, no more palpitations.  11/15/2020 - called our clinic because of palpitations, and also concerned about generic Bystolic. She tells me that the prior refill of Bystolic was from [place] and it was a different color, and she felt palpitations and strange sensations of vertigo etc. while taking it she just did not like the feeling.  The next refill was from [place] and was a different color.  She felt different with that as well.  She is very frustrated that the pharmacists change the prescriptions based on where they can get them filled and each time it makes her feel different.  She felt like maybe this was causing palpitations.  Thankfully, over the last week or so symptoms seem to have improved.  She has cut back the caffeine.  She is tolerating the new Bystolic relatively well.  We did clarify that we would fill the Bystolic as a tradename medicine only.  She is also having issues with Glucophage.  By the time she came in today, she seems to be feeling a little better.  Her palpitations have improved.  She  is happy to hear that her new prescription is being sent to ensure that she gets Technical brewer.  She is agreeable to restart low-dose Crestor understand that her cholesterol level needs to be treated.  Thankfully, despite all these issues, she has not had any chest pain or pressure with rest or exertion.  No CHF symptoms.  Palpitations seem to have resolved.  CV Review of Symptoms (Summary)no chest pain or dyspnea on exertion positive for - irregular heartbeat, palpitations and Lightheadedness, nausea GI upset from different medications.  No longer having the vertigo type symptoms that she was having 3 weeks ago. negative for - edema, orthopnea, paroxysmal  nocturnal dyspnea, rapid heart rate, shortness of breath or Syncope or near syncope, TIA/amaurosis fugax, or claudication.  The patient does not have symptoms concerning for COVID-19 infection (fever, chills, cough, or new shortness of breath).   REVIEWED OF SYSTEMS   Review of Systems  Constitutional: Positive for malaise/fatigue (Not getting of sleep). Negative for weight loss.  HENT: Negative for congestion and nosebleeds.   Respiratory: Negative for cough and shortness of breath.   Gastrointestinal: Negative for blood in stool and melena.  Genitourinary: Negative for hematuria.  Musculoskeletal: Positive for myalgias (Felt funny with medicines).  Neurological: Positive for dizziness and weakness.       Strain symptoms from different versions of Bystolic.  Psychiatric/Behavioral: Positive for depression and memory loss. The patient is nervous/anxious and has insomnia.     I have reviewed and (if needed) personally updated the patient's problem list, medications, allergies, past medical and surgical history, social and family history.   PAST MEDICAL HISTORY   Past Medical History:  Diagnosis Date  . DM type 2 (diabetes mellitus, type 2) ([place])   . Dyslipidemia   . Heart palpitations    No documented Arrhythmia  . HTN  (hypertension), benign    Prior History of Presumed Hypertensive Cardiomyopathy with LVH - not confrimed by Echo in 2010 & 2015    PAST SURGICAL HISTORY   Past Surgical History:  Procedure Laterality Date  . TRANSTHORACIC ECHOCARDIOGRAM  January 2015, August 2018   a) 2015 - NormalLV size & function, EF 60-65% with Gr 1 DD, mild Ao Sclerosis with mild-mod AI;; b) no significant change    Immunization History  Administered Date(s) Administered  . PFIZER(Purple Top)SARS-COV-2 Vaccination 01/19/2020, 02/10/2020, 08/20/2020    MEDICATIONS/ALLERGIES   Current Meds  Medication Sig  . BYSTOLIC 20 MG TABS Take 1 tablet (20 mg total) by mouth daily.  . Cholecalciferol (VITAMIN D3) 2000 UNITS capsule Take 5,000 Units by mouth daily.   . Coenzyme Q10 (CO Q 10) 100 MG CAPS Take 300 mg by mouth daily.   Marland Kitchen ezetimibe (ZETIA) 10 MG tablet Take 10 mg by mouth daily.  . hydrOXYzine (ATARAX/VISTARIL) 25 MG tablet Take 25 mg  One tablet by mouth once a day or as needed  . JARDIANCE 25 MG TABS tablet Take 12.5 mg by mouth 2 (two) times daily.   . metFORMIN (GLUCOPHAGE) 500 MG tablet Take 500 mg by mouth 2 (two) times daily.   . Multiple Vitamin (MULTIVITAMIN) tablet Take 1 tablet by mouth daily.  . Omega-3 Fatty Acids (EMULSIFIED OMEGA-3) 161-0960 MG/15ML LIQD Takes 1 tablespon by mouth daily.  . [DISCONTINUED] amLODipine (NORVASC) 5 MG tablet TAKE 1 TABLET BY MOUTH EVERY DAY    Allergies  Allergen Reactions  . Corn-Containing Products Anaphylaxis  . Ibuprofen Shortness Of Breath  . Peanuts [Peanut Oil] Anaphylaxis  . Penicillins Shortness Of Breath  . Shellfish Allergy Anaphylaxis  . Soy Allergy Anaphylaxis    Only sometimes    SOCIAL HISTORY/FAMILY HISTORY   Reviewed in Epic:  Pertinent findings:  Social History   Tobacco Use  . Smoking status: Never Smoker  . Smokeless tobacco: Never Used  Substance Use Topics  . Alcohol use: No  . Drug use: No   Social History   Social  History Narrative   She is recently widowed - her husband "Smitty" (also a patient of mine) died somewhat unexpectedly from complications of a GI bleed in December 2019)   Mother of 2.   He does not  smoke or drink alcohol.   She tries to exercise, has been not as compliant as she had in the past.       OBJCTIVE -PE, EKG, labs   Wt Readings from Last 3 Encounters:  11/22/20 133 lb (60.3 kg)  08/31/20 135 lb 3.2 oz (61.3 kg)  04/29/20 134 lb 3.2 oz (60.9 kg)    Physical Exam: BP (!) 150/74   Pulse 78   Ht 5\' 4"  (1.626 m)   Wt 133 lb (60.3 kg)   SpO2 98%   BMI 22.83 kg/m  Physical Exam Vitals reviewed.  Constitutional:      General: She is not in acute distress.    Appearance: Normal appearance. She is normal weight. She is not ill-appearing or toxic-appearing.  HENT:     Head: Normocephalic and atraumatic.  Neck:     Vascular: No carotid bruit, hepatojugular reflux or JVD.  Cardiovascular:     Rate and Rhythm: Normal rate and regular rhythm.  No extrasystoles are present.    Chest Wall: PMI is not displaced.     Pulses: Normal pulses.     Heart sounds: S1 normal and S2 normal. Murmur (Soft 1/2 SEM at RUSB) heard.  No friction rub. No gallop.   Pulmonary:     Effort: Pulmonary effort is normal. No respiratory distress.     Breath sounds: Normal breath sounds.  Chest:     Chest wall: No tenderness.  Musculoskeletal:        General: No swelling. Normal range of motion.     Cervical back: Normal range of motion and neck supple.  Skin:    General: Skin is warm and dry.  Neurological:     General: No focal deficit present.     Mental Status: She is alert and oriented to person, place, and time.  Psychiatric:        Mood and Affect: Mood normal.        Behavior: Behavior normal.        Thought Content: Thought content normal.        Judgment: Judgment normal.     Comments: Cc become now, but has clearly been upset about all of these issues.      Adult ECG Report   Rate: 78 ;  Rhythm: normal sinus rhythm and Nonspecific ST and T wave changes.  Otherwise normal axis, intervals and durations.;   Narrative Interpretation: Stable EKG.  Recent Labs:   08/10/2020: TC 189, TG 93, HDL 49, LDL 123.  A1c 7.3.  Cr at 12.9 No results found for: CHOL, HDL, LDLCALC, LDLDIRECT, TRIG, CHOLHDL No results found for: CREATININE, BUN, NA, K, CL, CO2 No flowsheet data found.  No results found for: TSH  ==================================================  COVID-19 Education: The signs and symptoms of COVID-19 were discussed with the patient and how to seek care for testing (follow up with PCP or arrange E-visit).   The importance of social distancing and COVID-19 vaccination was discussed today. The patient is practicing social distancing & Masking.   I spent a total of 43 minutes with the patient spent in direct patient consultation.   Quinita was here with an agenda today, had multiple issues to discuss.  We had a very long discussion most of the time, I simply listened.  I tried to explain some of the medication interactions etc.  Additional time spent with chart review  / charting (studies, outside notes, etc): 15 min  Several telephone calls and urgent care visits reviewed.  Total Time: 58 min   Current medicines are reviewed at length with the patient today.  (+/- concerns) new prescription for Bystolic sent.  This visit occurred during the SARS-CoV-2 public health emergency.  Safety protocols were in place, including screening questions prior to the visit, additional usage of staff PPE, and extensive cleaning of exam room while observing appropriate contact time as indicated for disinfecting solutions.  Notice: This dictation was prepared with Dragon dictation along with smaller phrase technology. Any transcriptional errors that result from this process are unintentional and may not be corrected upon review.  Patient Instructions / Medication Changes &  Studies & Tests Ordered   Patient Instructions  Medication Instructions:   start taking Amlodipine  And Zetia in the morning time   *If you need a refill on your cardiac medications before your next appointment, please call your pharmacy*   Lab Work:  Not needed.   Testing/Procedures:  Not needed  Follow-Up: At [facility], you and your health needs are our priority.  As part of our continuing mission to provide you with exceptional heart care, we have created designated Provider Care Teams.  These Care Teams include your primary Cardiologist (physician) and Advanced Practice Providers (APPs -  Physician Assistants and Nurse Practitioners) who all work together to provide you with the care you need, when you need it.     Your next appointment:    keep appointment for June 2022  The format for your next appointment:   In Person  Provider:   Glenetta Hew, MD   Other Instructions    Studies Ordered:   Orders Placed This Encounter  Procedures  . EKG 12-Lead     Glenetta Hew, M.D., M.S. Interventional Cardiologist   Pager # [phone] Phone # [phone] [address] [place], [place] [address]   Thank you for choosing Heartcare at [facility]!!

## 2020-12-10 DIAGNOSIS — E119 Type 2 diabetes mellitus without complications: Secondary | ICD-10-CM | POA: Diagnosis not present

## 2020-12-10 DIAGNOSIS — I10 Essential (primary) hypertension: Secondary | ICD-10-CM | POA: Diagnosis not present

## 2020-12-10 DIAGNOSIS — E7800 Pure hypercholesterolemia, unspecified: Secondary | ICD-10-CM | POA: Diagnosis not present

## 2020-12-17 ENCOUNTER — Encounter: Payer: Self-pay | Admitting: Cardiology

## 2020-12-17 DIAGNOSIS — N1831 Chronic kidney disease, stage 3a: Secondary | ICD-10-CM | POA: Diagnosis not present

## 2020-12-17 DIAGNOSIS — G44309 Post-traumatic headache, unspecified, not intractable: Secondary | ICD-10-CM | POA: Diagnosis not present

## 2020-12-17 DIAGNOSIS — E119 Type 2 diabetes mellitus without complications: Secondary | ICD-10-CM | POA: Diagnosis not present

## 2020-12-17 DIAGNOSIS — I10 Essential (primary) hypertension: Secondary | ICD-10-CM | POA: Diagnosis not present

## 2020-12-17 DIAGNOSIS — G4700 Insomnia, unspecified: Secondary | ICD-10-CM | POA: Insufficient documentation

## 2020-12-17 NOTE — Assessment & Plan Note (Signed)
Her blood pressure is high today, but she is quite "amped up ".  Plan:  Continue Bystolic, but cannot keep it as tradename.  Continue amlodipine but switch dosing to morning with Bystolic being at night.  Ensure adequate hydration to avoid palpitations.

## 2020-12-17 NOTE — Assessment & Plan Note (Signed)
Relatively well controlled with Bystolic, but she had issues with Bystolic and was not taking it.  She is now back on it.  She is also back to avoiding her triggers of caffeine and sweets.  Also recommended more aggressive hydration.

## 2020-12-17 NOTE — Assessment & Plan Note (Signed)
Not at goal.  She is willing to restart Crestor.  We will start at 5 mg every other day taking it in the evening and switching Zetia to morning.  Target LDL should be less than 100.  She did not tolerate Metformin, but remains on Jardiance.

## 2020-12-17 NOTE — Assessment & Plan Note (Addendum)
She has lots of internal stresses and concerns.  I think she just needs some help her sleep.  In the past she has done well with hydroxyzine  We will prescribe her PRN hydroxyzine.

## 2020-12-17 NOTE — Assessment & Plan Note (Signed)
Stable.  No need to recheck echo.

## 2020-12-22 ENCOUNTER — Encounter: Payer: Self-pay | Admitting: Neurology

## 2021-01-31 NOTE — Progress Notes (Signed)
NEUROLOGY CONSULTATION NOTE  [name] MRN: [id number] DOB: 1940/11/24  Referring provider: Jani Gravel, MD Primary care provider: Jani Gravel, MD  Reason for consult:  Headache, vertigo  Assessment/Plan:   1.  Brief paroxysmal head pain - uncertain etiology.  May be a type of primary stabbing headache.  However, since the headaches have been ongoing for many years and infrequent, and with normal neurologic exam, I don't suspect anything alarming.  I don't appreciate any residual abnormalities related to hitting her head. 2.  Chronic bilateral tinnitus - consider CBT or using white noise machine at night.  Recommended hearing exam.   Subjective:  Kathy [name] is a 80 year old  female with type 2 diabetes mellitus, HTN and dyslipidemia who presents for headache.  History supplemented by urgent care and referring provider's notes.  For many years she has had intermittent brief head pain, described as intense pain from the the left temple traveling to the right temple, lasting 10-15 seconds.  She may have an episode and then not have another one for 6 months.  In January, she reached up for a plastic bowl in her cabinet which fell and hit her on the forehead.  No loss of consciousness.  She sustained a knot which subsequently healed but had sensitivity to the area.  About 3 1/2 weeks later, she was advised by her friends to be checked out because of the news about how Viviano Simas passed away after hitting his head.  Whe went to Urgent Care on 11/09/2020 where she was told that everything looked okay.  Since she hit her head, she had two brief episodes of the head pain.  The second one was involving the right upper posterior parietal region. She does have significant history of recurrent major depressive disorder and GAD with panic attacks.  It has been worse over the past couple years since her husband passed away.   She has a history of intermittent vertigo as well as chronic tinnitus.   Prior workup included MRI of brain without contrast on 10/20/2005, which was personally reviewed, which showed some mild chronic small vessel ischemic changes but no acute findings.  CT head on 06/24/2019 personally reviewed was also unremarkable.     Remote history of migraines when she was young.   PAST MEDICAL HISTORY: Past Medical History:  Diagnosis Date  . DM type 2 (diabetes mellitus, type 2) ([place])   . Dyslipidemia   . Heart palpitations    No documented Arrhythmia  . HTN (hypertension), benign    Prior History of Presumed Hypertensive Cardiomyopathy with LVH - not confrimed by Echo in 2010 & 2015    PAST SURGICAL HISTORY: Past Surgical History:  Procedure Laterality Date  . TRANSTHORACIC ECHOCARDIOGRAM  January 2015, August 2018   a) 2015 - NormalLV size & function, EF 60-65% with Gr 1 DD, mild Ao Sclerosis with mild-mod AI;; b) no significant change    MEDICATIONS: Current Outpatient Medications on File Prior to Visit  Medication Sig Dispense Refill  . amLODipine (NORVASC) 5 MG tablet Take 1 tablet (5 mg total) by mouth daily. ( morning) 90 tablet 3  . BYSTOLIC 20 MG TABS Take 1 tablet (20 mg total) by mouth daily. 90 tablet 1  . Cholecalciferol (VITAMIN D3) 2000 UNITS capsule Take 5,000 Units by mouth daily.     . Coenzyme Q10 (CO Q 10) 100 MG CAPS Take 300 mg by mouth daily.     Marland Kitchen ezetimibe (ZETIA) 10 MG  tablet Take 10 mg by mouth daily.    . hydrOXYzine (ATARAX/VISTARIL) 25 MG tablet Take 25 mg  One tablet by mouth once a day or as needed 90 tablet 3  . JARDIANCE 25 MG TABS tablet Take 12.5 mg by mouth 2 (two) times daily.   1  . metFORMIN (GLUCOPHAGE) 500 MG tablet Take 500 mg by mouth 2 (two) times daily.     . Multiple Vitamin (MULTIVITAMIN) tablet Take 1 tablet by mouth daily.    . Omega-3 Fatty Acids (EMULSIFIED OMEGA-3) 295-2841 MG/15ML LIQD Takes 1 tablespon by mouth daily.     No current facility-administered medications on file prior to visit.     ALLERGIES: Allergies  Allergen Reactions  . Corn-Containing Products Anaphylaxis  . Ibuprofen Shortness Of Breath  . Peanuts [Peanut Oil] Anaphylaxis  . Penicillins Shortness Of Breath  . Shellfish Allergy Anaphylaxis  . Soy Allergy Anaphylaxis    Only sometimes    FAMILY HISTORY: Family History  Problem Relation Age of Onset  . Diabetes Brother   . Arrhythmia Brother   . Diabetes Brother     Objective:  Blood pressure (!) 175/76, pulse 69, height 5\' 3"  (1.6 m), weight 137 lb 9.6 oz (62.4 kg), SpO2 99 %. General: No acute distress.  Patient appears well-groomed.   Head:  Normocephalic/atraumatic Eyes:  fundi examined but not visualized Neck: supple, no paraspinal tenderness, full range of motion Back: No paraspinal tenderness Heart: regular rate and rhythm Lungs: Clear to auscultation bilaterally. Vascular: No carotid bruits. Neurological Exam: Mental status: alert and oriented to person, place, and time, recent and remote memory intact, fund of knowledge intact, attention and concentration intact, speech fluent and not dysarthric, language intact. Cranial nerves: CN I: not tested CN II: pupils equal, round and reactive to light, visual fields intact CN III, IV, VI:  full range of motion, no nystagmus, no ptosis CN V: facial sensation intact. CN VII: upper and lower face symmetric CN VIII: hearing intact CN IX, X: gag intact, uvula midline CN XI: sternocleidomastoid and trapezius muscles intact CN XII: tongue midline Bulk & Tone: normal, no fasciculations. Motor:  muscle strength 5/5 throughout Sensation:  Pinprick, temperature and vibratory sensation intact. Deep Tendon Reflexes:  2+ throughout,  toes downgoing.   Finger to nose testing:  Without dysmetria.   Heel to shin:  Without dysmetria.   Gait:  Normal station and stride.  Romberg negative.    Thank you for allowing me to take part in the care of this patient.  Metta Clines, DO

## 2021-02-01 ENCOUNTER — Other Ambulatory Visit: Payer: Self-pay

## 2021-02-01 ENCOUNTER — Ambulatory Visit (INDEPENDENT_AMBULATORY_CARE_PROVIDER_SITE_OTHER): Payer: Medicare Other | Admitting: Neurology

## 2021-02-01 ENCOUNTER — Encounter: Payer: Self-pay | Admitting: Neurology

## 2021-02-01 VITALS — BP 175/76 | HR 69 | Ht 63.0 in | Wt 137.6 lb

## 2021-02-01 DIAGNOSIS — R519 Headache, unspecified: Secondary | ICD-10-CM | POA: Diagnosis not present

## 2021-02-01 DIAGNOSIS — H9313 Tinnitus, bilateral: Secondary | ICD-10-CM

## 2021-02-01 NOTE — Patient Instructions (Signed)
Neurologic exam is normal.  I don't suspect anything serious.

## 2021-03-03 ENCOUNTER — Telehealth: Payer: Self-pay | Admitting: Cardiology

## 2021-03-03 NOTE — Telephone Encounter (Signed)
Patient was just wanting to see if she could change to a virtual appointment on same day. She is feeling fine since March appointment without palpitations as she has decreased her caffeine intake and increased hydration. Explained he is in the office and does not see virtual appointments on his virtual days and offered her a virtual July 8th but she decided to keep current 6/20 appointment. No additional questions at this time.

## 2021-03-03 NOTE — Telephone Encounter (Signed)
     Pt would like to ask Dr. Ellyn Hack what is he going to do on her appt on 03/07/21, she said since its going to hot on Monday if its ok do to her appt as virtual visit.

## 2021-03-07 ENCOUNTER — Encounter: Payer: Self-pay | Admitting: Cardiology

## 2021-03-07 ENCOUNTER — Ambulatory Visit (INDEPENDENT_AMBULATORY_CARE_PROVIDER_SITE_OTHER): Payer: Medicare Other | Admitting: Cardiology

## 2021-03-07 ENCOUNTER — Other Ambulatory Visit: Payer: Self-pay

## 2021-03-07 VITALS — BP 151/70 | HR 67 | Ht 64.0 in | Wt 139.2 lb

## 2021-03-07 DIAGNOSIS — E1169 Type 2 diabetes mellitus with other specified complication: Secondary | ICD-10-CM

## 2021-03-07 DIAGNOSIS — I10 Essential (primary) hypertension: Secondary | ICD-10-CM

## 2021-03-07 DIAGNOSIS — R002 Palpitations: Secondary | ICD-10-CM | POA: Diagnosis not present

## 2021-03-07 DIAGNOSIS — E785 Hyperlipidemia, unspecified: Secondary | ICD-10-CM

## 2021-03-07 MED ORDER — CRESTOR 5 MG PO TABS: 5.0000 mg | ORAL_TABLET | Freq: Every day | ORAL | 3 refills | Status: DC

## 2021-03-07 NOTE — Progress Notes (Signed)
Primary Care Provider: Jani Gravel, MD Cardiologist: Glenetta Hew, MD Electrophysiologist: None  Clinic Note: Chief Complaint  Patient presents with   10-month follow-up    Per her request   Hypertension    Still not well controlled   Hyperlipidemia    Never started Crestor because she got generic meds     ===================================  ASSESSMENT/PLAN   Problem List Items Addressed This Visit     Moderate essential hypertension - Primary (Chronic)    Blood pressure still high today.  Based on her reluctance to take medications, simply continue with Bystolic and amlodipine at current dose..  Will allow for mild permissive hypertension and today's pressure is just a little bit above what I would like it to be.  Okay for systolic pressures up to 502 mmHg       Relevant Medications   CRESTOR 5 MG tablet   Dyslipidemia associated with type 2 diabetes mellitus ([place]) (Chronic)    Back in 2014, her lipids look great.  I am not sure what happened but she is now having issues with medications.  We will try to restart low-dose tradename Crestor.  We can recheck labs and see her back in follow-up       Relevant Medications   CRESTOR 5 MG tablet   Heart palpitations (Chronic)    Controlled with Bystolic and backing her caffeine.  A lot of this is related to her stress which until that goes away she will always have some symptoms.       ===================================  HPI:    Kathy [name] is a 80 y.o. female with a PMH notable for HYPERTENSION (difficult to treat) along with PALPITATIONS, HLD and DM-2 who presents today for 20-month follow-up despite plans for 74-month follow-up.  Kathy [name] was last seen on November 22, 2020 as a walk-in visit with multiple complaints about possible head injury and headaches with hypertension.  She also was not tolerating different medications-mostly stating that she has issues with generic medications.  She also noted she  is having significant palpitations which she contributed to drinking too much tea. -> We ask for tradename Bystolic and Crestor.  Unfortunately she got generic Crestor and did not take it. Also prescribed hydroxyzine to help her sleep  Recent Hospitalizations: None  Reviewed  CV studies:    The following studies were reviewed today: (if available, images/films reviewed: From Epic Chart or Care Everywhere) None:   Interval History:   Kathy [name] returns here today overall feeling a lot better.  She says that she is pretty much cut out the TV and therefore the palpitations have notably improved.  She still has some spells that last a few seconds off and on.  Nothing worrisome now.  She is down to just drinking 1 cup of coffee a day.  She acknowledges that she is under a lot of stress and psychological distress still not coping well with first the death of her husband and then [place] with all the ramifications go along with it.  She has started a journal, and was very excited that she is written about half a page in the journal in order to [place].  She says that with reduced stress and fatigue the palpitations are doing better.  She is making an effort to try to lose weight, not eating as much of the sweets that she has been eating is comfort foods.  She thinks her blood pressure is better controlled at home than  it is here.  She still has a general head hurting, but was happy that she finally got seen by provider and was cared for.  She was happy with the results.  She thinks she has learned to live with her symptoms.  CV Review of Symptoms (Summary) Cardiovascular ROS: positive for - palpitations, rapid heart rate, and e TAVR present headache/discomfort, palpitations are definitely improved-usually triggered by caffeine or stress. negative for - chest pain, edema, orthopnea, paroxysmal nocturnal dyspnea, shortness of breath, or lightheadedness, dizziness or syncope/near syncope,  TIA/amaurosis fugax, claudication  REVIEWED OF SYSTEMS   Review of Systems  Constitutional:  Negative for malaise/fatigue (Sleeping better.  Less fatigued) and weight loss (She realized she was gaining weight, has now made adjustments to her diet).  HENT:  Negative for congestion and nosebleeds.   Respiratory:  Negative for cough and shortness of breath.   Cardiovascular:        Per HPI  Gastrointestinal:  Negative for blood in stool and melena.       Still has GI upset every now and then related to different medications.  Genitourinary:  Negative for hematuria.  Musculoskeletal:  Positive for joint pain. Negative for falls and myalgias.  Neurological:  Positive for dizziness (Off-and-on) and headaches. Negative for focal weakness, loss of consciousness and weakness.  Psychiatric/Behavioral:  Positive for depression and memory loss. The patient is nervous/anxious and has insomnia.        This is one of her major issues.   I have reviewed and (if needed) personally updated the patient's problem list, medications, allergies, past medical and surgical history, social and family history.   PAST MEDICAL HISTORY   Past Medical History:  Diagnosis Date   DM type 2 (diabetes mellitus, type 2) ([place])    Dyslipidemia    Heart palpitations    No documented Arrhythmia   HTN (hypertension), benign    Prior History of Presumed Hypertensive Cardiomyopathy with LVH - not confrimed by Echo in 2010 & 2015    PAST SURGICAL HISTORY   Past Surgical History:  Procedure Laterality Date   TRANSTHORACIC ECHOCARDIOGRAM  January 2015, August 2018   a) 2015 - NormalLV size & function, EF 60-65% with Gr 1 DD, mild Ao Sclerosis with mild-mod AI;; b) no significant change    Immunization History  Administered Date(s) Administered   PFIZER(Purple Top)SARS-COV-2 Vaccination 01/19/2020, 02/10/2020, 08/20/2020    MEDICATIONS/ALLERGIES   Current Meds  Medication Sig   amLODipine (NORVASC) 5 MG tablet  Take 1 tablet (5 mg total) by mouth daily. ( morning)   BYSTOLIC 20 MG TABS Take 1 tablet (20 mg total) by mouth daily.   Cholecalciferol (VITAMIN D3) 2000 UNITS capsule Take 5,000 Units by mouth daily.    Coenzyme Q10 (CO Q 10) 100 MG CAPS Take 300 mg by mouth daily.    CRESTOR 5 MG tablet Take 1 tablet (5 mg total) by mouth daily. -> Never started taking   ezetimibe (ZETIA) 10 MG tablet Take 10 mg by mouth daily.   hydrOXYzine (ATARAX/VISTARIL) 25 MG tablet Take 25 mg  One tablet by mouth once a day or as needed   JARDIANCE 25 MG TABS tablet Take 12.5 mg by mouth 2 (two) times daily.    metFORMIN (GLUCOPHAGE) 500 MG tablet Take 500 mg by mouth 2 (two) times daily.    Multiple Vitamin (MULTIVITAMIN) tablet Take 1 tablet by mouth daily.   Omega-3 Fatty Acids (EMULSIFIED OMEGA-3) 856-3149 MG/15ML LIQD Takes 1 tablespon by  mouth daily.    Allergies  Allergen Reactions   Corn-Containing Products Anaphylaxis   Ibuprofen Shortness Of Breath   Peanuts [Peanut Oil] Anaphylaxis   Penicillins Shortness Of Breath   Shellfish Allergy Anaphylaxis   Soy Allergy Anaphylaxis    Only sometimes    SOCIAL HISTORY/FAMILY HISTORY   Reviewed in Epic:  Pertinent findings:  Social History   Tobacco Use   Smoking status: Never   Smokeless tobacco: Never  Substance Use Topics   Alcohol use: No   Drug use: No   Social History   Social History Narrative   She is recently widowed - her husband "Engineer, manufacturing systems" (also a patient of mine) died somewhat unexpectedly from complications of a GI bleed in December 2019)   Mother of 2.   He does not smoke or drink alcohol.   She tries to exercise, has been not as compliant as she had in the past.       OBJCTIVE -PE, EKG, labs   Wt Readings from Last 3 Encounters:  03/07/21 139 lb 3.2 oz (63.1 kg)  02/01/21 137 lb 9.6 oz (62.4 kg)  11/22/20 133 lb (60.3 kg)    Physical Exam: BP (!) 151/70   Pulse 67   Ht 5\' 4"  (1.626 m)   Wt 139 lb 3.2 oz (63.1 kg)    SpO2 98%   BMI 23.89 kg/m  Physical Exam Vitals reviewed.  Constitutional:      General: She is not in acute distress.    Appearance: Normal appearance. She is normal weight. She is not ill-appearing or toxic-appearing.  HENT:     Head: Normocephalic and atraumatic.  Neck:     Vascular: No carotid bruit or JVD.  Cardiovascular:     Rate and Rhythm: Normal rate and regular rhythm. No extrasystoles are present.    Chest Wall: PMI is not displaced.     Pulses: Normal pulses.     Heart sounds: Murmur (Soft 1/6 SEM at RUSB-neck) heard.    No friction rub. No gallop.  Pulmonary:     Effort: Pulmonary effort is normal. No respiratory distress.     Breath sounds: Normal breath sounds.  Chest:     Chest wall: Tenderness present.  Musculoskeletal:        General: No swelling (Trivial ankle). Normal range of motion.     Cervical back: Normal range of motion and neck supple.  Skin:    General: Skin is warm and dry.  Neurological:     General: No focal deficit present.     Mental Status: She is alert and oriented to person, place, and time.  Psychiatric:        Mood and Affect: Mood normal.        Behavior: Behavior normal.        Thought Content: Thought content normal.     Comments: Still anxious, but overall doing better.     Adult ECG Report  N/a  Recent Labs: Reviewed -- 08/10/2020: TC 189, TG 93, HDL 49, LDL 123.  A1c 7.3.  Cr at 12.9 No results found for: CHOL, HDL, LDLCALC, LDLDIRECT, TRIG, CHOLHDL No results found for: CREATININE, BUN, NA, K, CL, CO2 No flowsheet data found.  No results found for: TSH  ==================================================  COVID-19 Education: The signs and symptoms of COVID-19 were discussed with the patient and how to seek care for testing (follow up with PCP or arrange E-visit).    I spent a total of 18 minutes with the  patient spent in direct patient consultation.  Additional time spent with chart review  / charting (studies,  outside notes, etc): 13 min Total Time: 31 min  Current medicines are reviewed at length with the patient today.  (+/- concerns) We talked about her concerns and fears about medications.  She is agreeable to retry tradename rosuvastatin.  This visit occurred during the SARS-CoV-2 public health emergency.  Safety protocols were in place, including screening questions prior to the visit, additional usage of staff PPE, and extensive cleaning of exam room while observing appropriate contact time as indicated for disinfecting solutions.  Notice: This dictation was prepared with Dragon dictation along with smaller phrase technology. Any transcriptional errors that result from this process are unintentional and may not be corrected upon review.  Patient Instructions / Medication Changes & Studies & Tests Ordered   Patient Instructions  Medication Instructions:   Refilled  Crestor - ( name brand  ordered)  *If you need a refill on your cardiac medications before your next appointment, please call your pharmacy*   Lab Work: Not needed    Testing/Procedures:  Not needed  Follow-Up: At [facility] you and your health needs are our priority.  As part of our continuing mission to provide you with exceptional heart care, we have created designated Provider Care Teams.  These Care Teams include your primary Cardiologist (physician) and Advanced Practice Providers (APPs -  Physician Assistants and Nurse Practitioners) who all work together to provide you with the care you need, when you need it.     Your next appointment:   6 month(s)  The format for your next appointment:   In Person  Provider:   Glenetta Hew, MD    Studies Ordered:   No orders of the defined types were placed in this encounter.    Glenetta Hew, M.D., M.S. Interventional Cardiologist   Pager # [phone] Phone # [phone] [address] [place], [place] [address]   Thank you for choosing  Heartcare at [facility]!!

## 2021-03-07 NOTE — Patient Instructions (Signed)
Medication Instructions:   Refilled  Crestor - ( name brand  ordered)  *If you need a refill on your cardiac medications before your next appointment, please call your pharmacy*   Lab Work: Not needed    Testing/Procedures:  Not needed  Follow-Up: At [facility], you and your health needs are our priority.  As part of our continuing mission to provide you with exceptional heart care, we have created designated Provider Care Teams.  These Care Teams include your primary Cardiologist (physician) and Advanced Practice Providers (APPs -  Physician Assistants and Nurse Practitioners) who all work together to provide you with the care you need, when you need it.     Your next appointment:   6 month(s)  The format for your next appointment:   In Person  Provider:   Glenetta Hew, MD

## 2021-03-10 ENCOUNTER — Encounter: Payer: Self-pay | Admitting: Cardiology

## 2021-03-10 NOTE — Assessment & Plan Note (Signed)
Back in 2014, her lipids look great.  I am not sure what happened but she is now having issues with medications.  We will try to restart low-dose tradename Crestor.  We can recheck labs and see her back in follow-up

## 2021-03-10 NOTE — Assessment & Plan Note (Signed)
Controlled with Bystolic and backing her caffeine.  A lot of this is related to her stress which until that goes away she will always have some symptoms.

## 2021-03-10 NOTE — Assessment & Plan Note (Signed)
Blood pressure still high today.  Based on her reluctance to take medications, simply continue with Bystolic and amlodipine at current dose..  Will allow for mild permissive hypertension and today's pressure is just a little bit above what I would like it to be.  Okay for systolic pressures up to 861 mmHg

## 2021-04-22 DIAGNOSIS — L659 Nonscarring hair loss, unspecified: Secondary | ICD-10-CM | POA: Diagnosis not present

## 2021-04-22 DIAGNOSIS — Z0000 Encounter for general adult medical examination without abnormal findings: Secondary | ICD-10-CM | POA: Diagnosis not present

## 2021-04-22 DIAGNOSIS — I10 Essential (primary) hypertension: Secondary | ICD-10-CM | POA: Diagnosis not present

## 2021-04-22 DIAGNOSIS — E7800 Pure hypercholesterolemia, unspecified: Secondary | ICD-10-CM | POA: Diagnosis not present

## 2021-04-22 DIAGNOSIS — E118 Type 2 diabetes mellitus with unspecified complications: Secondary | ICD-10-CM | POA: Diagnosis not present

## 2021-04-25 DIAGNOSIS — E7800 Pure hypercholesterolemia, unspecified: Secondary | ICD-10-CM | POA: Diagnosis not present

## 2021-04-25 DIAGNOSIS — H9313 Tinnitus, bilateral: Secondary | ICD-10-CM | POA: Diagnosis not present

## 2021-04-25 DIAGNOSIS — I10 Essential (primary) hypertension: Secondary | ICD-10-CM | POA: Diagnosis not present

## 2021-04-25 DIAGNOSIS — N1831 Chronic kidney disease, stage 3a: Secondary | ICD-10-CM | POA: Diagnosis not present

## 2021-06-13 ENCOUNTER — Other Ambulatory Visit: Payer: Self-pay | Admitting: Cardiology

## 2021-06-21 DIAGNOSIS — R519 Headache, unspecified: Secondary | ICD-10-CM | POA: Diagnosis not present

## 2021-06-21 DIAGNOSIS — G8929 Other chronic pain: Secondary | ICD-10-CM | POA: Diagnosis not present

## 2021-06-21 DIAGNOSIS — R6889 Other general symptoms and signs: Secondary | ICD-10-CM | POA: Diagnosis not present

## 2021-08-02 DIAGNOSIS — H16143 Punctate keratitis, bilateral: Secondary | ICD-10-CM | POA: Diagnosis not present

## 2021-08-02 DIAGNOSIS — G245 Blepharospasm: Secondary | ICD-10-CM | POA: Diagnosis not present

## 2021-08-15 ENCOUNTER — Other Ambulatory Visit: Payer: Self-pay

## 2021-08-15 ENCOUNTER — Ambulatory Visit (INDEPENDENT_AMBULATORY_CARE_PROVIDER_SITE_OTHER): Payer: Medicare Other | Admitting: Cardiology

## 2021-08-15 ENCOUNTER — Encounter: Payer: Self-pay | Admitting: Cardiology

## 2021-08-15 VITALS — BP 162/70 | HR 69 | Ht 64.0 in | Wt 136.8 lb

## 2021-08-15 DIAGNOSIS — E1169 Type 2 diabetes mellitus with other specified complication: Secondary | ICD-10-CM

## 2021-08-15 DIAGNOSIS — R002 Palpitations: Secondary | ICD-10-CM | POA: Diagnosis not present

## 2021-08-15 DIAGNOSIS — E785 Hyperlipidemia, unspecified: Secondary | ICD-10-CM | POA: Diagnosis not present

## 2021-08-15 DIAGNOSIS — I10 Essential (primary) hypertension: Secondary | ICD-10-CM | POA: Diagnosis not present
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
## 2021-08-15 NOTE — Patient Instructions (Addendum)
Medication Instructions:    Start taking  CoQ10 and Rosuvastatin together.  *If you need a refill on your cardiac medications before your next appointment, please call your pharmacy*   Lab Work:  Not needed   Testing/Procedures:  Not needed  Follow-Up: At [facility], you and your health needs are our priority.  As part of our continuing mission to provide you with exceptional heart care, we have created designated Provider Care Teams.  These Care Teams include your primary Cardiologist (physician) and Advanced Practice Providers (APPs -  Physician Assistants and Nurse Practitioners) who all work together to provide you with the care you need, when you need it.     Your next appointment:   5 to 6 month(s)  The format for your next appointment:   In Person  Provider:   Glenetta Hew, MD

## 2021-08-19 DIAGNOSIS — I10 Essential (primary) hypertension: Secondary | ICD-10-CM | POA: Diagnosis not present

## 2021-08-19 DIAGNOSIS — E119 Type 2 diabetes mellitus without complications: Secondary | ICD-10-CM | POA: Diagnosis not present

## 2021-09-07 ENCOUNTER — Encounter: Payer: Self-pay | Admitting: Cardiology

## 2021-09-07 NOTE — Assessment & Plan Note (Signed)
Blood pressure high today.  She is very stressed out and anxious.  I have asked that she follow her pressures at home and simply allow her pressures normalized with less stress.  We allow for mild permissive hypertension-systolics up to 696 mg daily.  Continue amlodipine and Bystolic using tradename [employer]

## 2021-09-07 NOTE — Assessment & Plan Note (Signed)
Has been Pribble controlled with Bystolic and back pain.  Still present because of stress and anxiety, but better.  She needs to be on something for stress and anxiety.

## 2021-09-08 NOTE — Assessment & Plan Note (Signed)
Unfortunate, they will have labs recently.  Last labs are from November 2021.  LDL that time was 123.  She is on Crestor now which I told her she should take that along with the co-Q10 together.  She should have labs checked by PCP soon.

## 2021-09-09 DIAGNOSIS — R519 Headache, unspecified: Secondary | ICD-10-CM | POA: Diagnosis not present

## 2021-09-14 DIAGNOSIS — Z0000 Encounter for general adult medical examination without abnormal findings: Secondary | ICD-10-CM | POA: Diagnosis not present

## 2021-09-14 DIAGNOSIS — I10 Essential (primary) hypertension: Secondary | ICD-10-CM | POA: Diagnosis not present

## 2021-09-14 DIAGNOSIS — N1831 Chronic kidney disease, stage 3a: Secondary | ICD-10-CM | POA: Diagnosis not present

## 2021-09-14 DIAGNOSIS — E7800 Pure hypercholesterolemia, unspecified: Secondary | ICD-10-CM | POA: Diagnosis not present

## 2021-09-14 DIAGNOSIS — E118 Type 2 diabetes mellitus with unspecified complications: Secondary | ICD-10-CM | POA: Diagnosis not present

## 2021-12-09 ENCOUNTER — Other Ambulatory Visit: Payer: Self-pay | Admitting: Cardiology

## 2022-01-17 DIAGNOSIS — M8589 Other specified disorders of bone density and structure, multiple sites: Secondary | ICD-10-CM | POA: Diagnosis not present

## 2022-01-17 DIAGNOSIS — Z13820 Encounter for screening for osteoporosis: Secondary | ICD-10-CM | POA: Diagnosis not present

## 2022-01-17 DIAGNOSIS — Z780 Asymptomatic menopausal state: Secondary | ICD-10-CM | POA: Diagnosis not present

## 2022-03-09 DIAGNOSIS — Z1231 Encounter for screening mammogram for malignant neoplasm of breast: Secondary | ICD-10-CM | POA: Diagnosis not present

## 2022-03-15 ENCOUNTER — Telehealth: Payer: Self-pay | Admitting: Cardiology

## 2022-03-15 ENCOUNTER — Other Ambulatory Visit: Payer: Self-pay | Admitting: Cardiology

## 2022-03-15 NOTE — Telephone Encounter (Signed)
 *  STAT* If patient is at the pharmacy, call can be transferred to refill team.   1. Which medications need to be refilled? (please list name of each medication and dose if known) BYSTOLIC 20 MG TABS  2. Which pharmacy/location (including street and city if local pharmacy) is medication to be sent to? [id number] [place],  - [address]  3. Do they need a 30 day or 90 day supply?90 days  Pt is out of medications

## 2022-03-16 MED ORDER — BYSTOLIC 20 MG PO TABS: 1.0000 | ORAL_TABLET | Freq: Every day | ORAL | 2 refills | Status: DC

## 2022-03-16 NOTE — Telephone Encounter (Signed)
Pt called in and stated that pharmacy is needing a new PA for this Medication.  The previous PA has running out and they are needing a new one.  Pt has Tried and failed the generic and is allergic to it.  She has been without it for 2 days. She would like to know if this can be rushed.  She would like a call back from the nurse to find out what she needs to do in the mean time.     Best number [phone]

## 2022-03-16 NOTE — Telephone Encounter (Signed)
Insurance called to follow-up on behalf of patient on the status of this refill.    Patient is out of this medication.

## 2022-03-17 NOTE — Telephone Encounter (Signed)
**Note De-Identified [place] Dube Obfuscation** I called [employer] at [phone] and did an urgent PA for the pts Bystolic (intolerance to filler ingredients in Nebivolol) over the phone with Janett Billow. Per Janett Billow this PA has been approved until 09/17/2022.  I have notified the pt and [id number] [place](Ph: [phone] of this approval. The pt expressed much gratitude for my quick assistance.

## 2022-03-17 NOTE — Telephone Encounter (Signed)
Routed to [facility] to assist with PA request

## 2022-03-28 ENCOUNTER — Ambulatory Visit (INDEPENDENT_AMBULATORY_CARE_PROVIDER_SITE_OTHER): Payer: Medicare Other | Admitting: Cardiology

## 2022-03-28 ENCOUNTER — Encounter: Payer: Self-pay | Admitting: Cardiology

## 2022-03-28 VITALS — BP 140/80 | HR 66 | Ht 64.0 in | Wt 136.0 lb

## 2022-03-28 DIAGNOSIS — E785 Hyperlipidemia, unspecified: Secondary | ICD-10-CM | POA: Diagnosis not present

## 2022-03-28 DIAGNOSIS — I358 Other nonrheumatic aortic valve disorders: Secondary | ICD-10-CM

## 2022-03-28 DIAGNOSIS — I10 Essential (primary) hypertension: Secondary | ICD-10-CM | POA: Diagnosis not present

## 2022-03-28 DIAGNOSIS — R002 Palpitations: Secondary | ICD-10-CM

## 2022-03-28 DIAGNOSIS — E1169 Type 2 diabetes mellitus with other specified complication: Secondary | ICD-10-CM
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
## 2022-03-28 NOTE — Patient Instructions (Signed)
Medication Instructions:   No changes *If you need a refill on your cardiac medications before your next appointment, please call your pharmacy*   Lab Work: Not needed    Testing/Procedures:  Not needed  Follow-Up: At [facility], you and your health needs are our priority.  As part of our continuing mission to provide you with exceptional heart care, we have created designated Provider Care Teams.  These Care Teams include your primary Cardiologist (physician) and Advanced Practice Providers (APPs -  Physician Assistants and Nurse Practitioners) who all work together to provide you with the care you need, when you need it.     Your next appointment:   5  to 6 month(s)  The format for your next appointment:   In Person  Provider:   Glenetta Hew, MD    Other Instructions

## 2022-04-09 ENCOUNTER — Encounter: Payer: Self-pay | Admitting: Cardiology

## 2022-04-09 NOTE — Assessment & Plan Note (Signed)
Stable BP today.  No change.

## 2022-04-09 NOTE — Assessment & Plan Note (Signed)
Stable, benign aortic sclerosis.  Not any louder.  No need to follow-up.

## 2022-04-09 NOTE — Assessment & Plan Note (Signed)
Still having off-and-on palpitations but nothing significant.  Well-controlled on Bystolic-namebrand.

## 2022-04-09 NOTE — Assessment & Plan Note (Addendum)
Still having labs.  Remains on low-dose Crestor plus Zetia.  We have not checked any risk ratification with Coronary Calcium Score or anything, but she is happily on aspirin.  No bleeding issues.  She is on Jardiance and metformin, tolerating well.

## 2022-05-23 DIAGNOSIS — S46911A Strain of unspecified muscle, fascia and tendon at shoulder and upper arm level, right arm, initial encounter: Secondary | ICD-10-CM | POA: Diagnosis not present

## 2022-05-23 DIAGNOSIS — S46211A Strain of muscle, fascia and tendon of other parts of biceps, right arm, initial encounter: Secondary | ICD-10-CM | POA: Diagnosis not present

## 2022-05-25 DIAGNOSIS — D329 Benign neoplasm of meninges, unspecified: Secondary | ICD-10-CM | POA: Diagnosis not present

## 2022-05-25 DIAGNOSIS — M8580 Other specified disorders of bone density and structure, unspecified site: Secondary | ICD-10-CM | POA: Diagnosis not present

## 2022-05-25 DIAGNOSIS — M25511 Pain in right shoulder: Secondary | ICD-10-CM | POA: Diagnosis not present

## 2022-06-15 ENCOUNTER — Other Ambulatory Visit: Payer: Self-pay | Admitting: Internal Medicine

## 2022-06-15 DIAGNOSIS — E041 Nontoxic single thyroid nodule: Secondary | ICD-10-CM | POA: Diagnosis not present

## 2022-06-15 DIAGNOSIS — E7800 Pure hypercholesterolemia, unspecified: Secondary | ICD-10-CM | POA: Diagnosis not present

## 2022-06-15 DIAGNOSIS — E118 Type 2 diabetes mellitus with unspecified complications: Secondary | ICD-10-CM | POA: Diagnosis not present

## 2022-06-15 DIAGNOSIS — E1169 Type 2 diabetes mellitus with other specified complication: Secondary | ICD-10-CM | POA: Diagnosis not present

## 2022-06-15 DIAGNOSIS — I10 Essential (primary) hypertension: Secondary | ICD-10-CM | POA: Diagnosis not present

## 2022-06-30 DIAGNOSIS — E041 Nontoxic single thyroid nodule: Secondary | ICD-10-CM | POA: Diagnosis not present

## 2022-06-30 DIAGNOSIS — E042 Nontoxic multinodular goiter: Secondary | ICD-10-CM | POA: Diagnosis not present

## 2022-07-04 DIAGNOSIS — M8580 Other specified disorders of bone density and structure, unspecified site: Secondary | ICD-10-CM | POA: Diagnosis not present

## 2022-07-04 DIAGNOSIS — E042 Nontoxic multinodular goiter: Secondary | ICD-10-CM | POA: Diagnosis not present

## 2022-07-04 DIAGNOSIS — Z780 Asymptomatic menopausal state: Secondary | ICD-10-CM | POA: Diagnosis not present

## 2022-07-04 DIAGNOSIS — Z79899 Other long term (current) drug therapy: Secondary | ICD-10-CM | POA: Diagnosis not present

## 2022-07-04 DIAGNOSIS — M8589 Other specified disorders of bone density and structure, multiple sites: Secondary | ICD-10-CM | POA: Diagnosis not present

## 2022-08-29 ENCOUNTER — Ambulatory Visit: Payer: Medicare Other | Attending: Cardiology | Admitting: Cardiology

## 2022-08-29 ENCOUNTER — Encounter: Payer: Self-pay | Admitting: Cardiology

## 2022-08-29 VITALS — BP 140/78 | HR 77 | Ht 63.5 in | Wt 139.0 lb

## 2022-08-29 DIAGNOSIS — I10 Essential (primary) hypertension: Secondary | ICD-10-CM

## 2022-08-29 DIAGNOSIS — E1169 Type 2 diabetes mellitus with other specified complication: Secondary | ICD-10-CM

## 2022-08-29 DIAGNOSIS — E785 Hyperlipidemia, unspecified: Secondary | ICD-10-CM | POA: Diagnosis not present

## 2022-08-29 DIAGNOSIS — R002 Palpitations: Secondary | ICD-10-CM

## 2022-08-29 MED ORDER — BYSTOLIC 20 MG PO TABS: 1.0000 | ORAL_TABLET | Freq: Every day | ORAL | 3 refills | Status: DC

## 2022-08-29 NOTE — Progress Notes (Signed)
Primary Care Provider: Deland Pretty, MD Cardiologist: Glenetta Hew, MD Electrophysiologist: None  Clinic Note: Chief Complaint  Patient presents with   Follow-up    ~ 6 month    Palpitations    Had an episode of palpitations back in November for about 2 weeks.  Thought related to anxiety.  Doing better.    ===================================  ASSESSMENT/PLAN   Problem List Items Addressed This Visit       Cardiology Problems   Moderate essential hypertension (Chronic)    BP on recheck today was 136 / 76 mmHg.  She is on a pretty stable regimen.  No changes for now.  Continue amlodipine 5 mg daily along with bisoprolol 20 mg daily.      Relevant Medications   BYSTOLIC 20 MG TABS     Other   Heart palpitations (Chronic)    With exception of the episode back in November likely associated with anxiety and stress from 4-year reunion of her husband's death, continue current dose of Bystolic.  Continue to refill Bystolic as tradename and not generic.      Dyslipidemia associated with type 2 diabetes mellitus ([place]) - Primary (Chronic)    Remains on a Zetia plus low-dose Crestor.  Has not had lipids checked that I can tell for now 3 years.  Should be due to have labs checked by PCP if not already checked. Currently on metformin 500 mg twice daily and Jardiance 25 mg (split into 12.5 m BID)       ===================================  HPI:    Kathy [name] is a 81 y.o. female with a PMH notable for HTN (labile/difficult to treat), multiple medical intolerances (tends to only be able to take tradename medications), palpitations, HLD, DM-2 who presents today for 6-8 month f/u at the request of Deland Pretty, MD.  [name] was last seen on March 28, 2022 over stable from a cardiac standpoint.  Fixated on her meningioma.  Also concerned about gadolinium.  Was rushing to get into her appointment and noted her blood pressure was high.  Usually much better at home.   No active cardiac symptoms.-Changes  Recent Hospitalizations: None  She was recently seen on 07/04/2022 by Mamie Laurel, DO  for Endocrinology consult based on thyroid nodules seen on ultrasound. => Thyroid US showed a dominant nodule in the isthmus (1.5 x 1.4 x 1 cm => partly solid partly cystic with septations.  No calcification and areas of decreased echogenicity.  Well-circumscribed margins) -> T RADS 4.  Right upper pole nodule was 1.3 x 1 x 0.7 cm.  Solid with smooth margins and no calcifications -> T RADS3 & L mid lobe noduel 81m - T RADS 2. ==> Felt to be Low Risk Pattern:  TSH normal. Plan: 6 -12 month f/u [place] Bx only if noted growth.  Reviewed  CV studies:    The following studies were reviewed today: (if available, images/films reviewed: From Epic Chart or Care Everywhere) None  Interval History:   BJOSSETTE ZIRBELreturns today overall doing pretty well.  She now has a new thing to fixate on which is the thyroid nodules.  She still indicated to me that they were discussing whether or not to do biopsies, however the note above reviewed indicates that the plan is to monitor and only do biopsy if size increases.  He had an episode of palpitations for about 2 weeks back in November probably related to the it being the anniversary of when her  husband passed.  (4 years ago).  She had off-and-on patterns of 1 to 2-second fluttering beats.  Nothing prolonged nothing that made her feel lightheaded or dizzy.  Definitely having some episodes of anxiety.  However no real chest pain or pressure.  No syncope or near syncope.  No PND, orthopnea or edema.  No TIA or amaurosis fugax.  No claudication.  REVIEWED OF SYSTEMS   Pertinent symptoms not noted above: Still having intermittent headaches; intermittent dizziness but no falls or loss of balance. Very anxious about: Meningioma, and now thyroid disease Palpitations well-controlled - 2 week spell noted above Less fatigue, getting better  sleep - worse with anxiety Weight stabilized. Mild joint pains. Depression symptoms seem to be getting better - had a setback in NOV (4 yr anniversary of her husband dying).  Had mild memory loss but still has anxiety.  I have reviewed and (if needed) personally updated the patient's problem list, medications, allergies, past medical and surgical history, social and family history.   PAST MEDICAL HISTORY   Past Medical History:  Diagnosis Date   DM type 2 (diabetes mellitus, type 2) ([place])    Dyslipidemia    Heart palpitations    No documented Arrhythmia   HTN (hypertension), benign    Prior History of Presumed Hypertensive Cardiomyopathy with LVH - not confrimed by Echo in 2010 & 2015    PAST SURGICAL HISTORY   Past Surgical History:  Procedure Laterality Date   TRANSTHORACIC ECHOCARDIOGRAM  January 2015, August 2018   a) 2015 - NormalLV size & function, EF 60-65% with Gr 1 DD, mild Ao Sclerosis with mild-mod AI;; b) no significant change   MRI brain 10/03/2021: 2.1 x 1.7 cm extra axial dural based mass within the left inferior aspect of the posterior fossa and at the level of the craniocervical junction.  Likely reflects meningioma.  Mass effect upon the medulla with mild flattening of the left aspect of the medulla noted. => Recommended contrast-enhanced MR to better evaluate.  Minimal chronic small vessel ischemic changes within the cerebral white matter similar to 2007.  Immunization History  Administered Date(s) Administered   PFIZER(Purple Top)SARS-COV-2 Vaccination 01/19/2020, 02/10/2020, 08/20/2020    MEDICATIONS/ALLERGIES   Current Meds  Medication Sig   amLODipine (NORVASC) 5 MG tablet Take 1 tablet (5 mg total) by mouth daily. ( morning)   aspirin 325 MG tablet Take 325 mg by mouth daily.   Cholecalciferol (VITAMIN D3) 2000 UNITS capsule Take 5,000 Units by mouth daily.    Coenzyme Q10 (CO Q 10) 100 MG CAPS Take 300 mg by mouth daily.    ezetimibe (ZETIA) 10 MG  tablet Take 10 mg by mouth daily.   hydrOXYzine (ATARAX/VISTARIL) 25 MG tablet Take 25 mg  One tablet by mouth once a day or as needed   JARDIANCE 25 MG TABS tablet Take 12.5 mg by mouth 2 (two) times daily.    metFORMIN (GLUCOPHAGE) 500 MG tablet Take 500 mg by mouth 2 (two) times daily.    Multiple Vitamin (MULTIVITAMIN) tablet Take 1 tablet by mouth daily.   Omega-3 Fatty Acids (EMULSIFIED OMEGA-3) 315-4008 MG/15ML LIQD Takes 1 tablespon by mouth daily.   [Refilled] BYSTOLIC 20 MG TABS Take 1 tablet (20 mg total) by mouth daily.  -> On Atarax for anxiety   Allergies  Allergen Reactions   Corn-Containing Products Anaphylaxis   Ibuprofen Shortness Of Breath   Peanuts [Peanut Oil] Anaphylaxis   Penicillins Shortness Of Breath   Shellfish Allergy Anaphylaxis  Soy Allergy Anaphylaxis    Only sometimes    SOCIAL HISTORY/FAMILY HISTORY   Reviewed in Epic:  Pertinent findings:  Social History   Tobacco Use   Smoking status: Never   Smokeless tobacco: Never  Substance Use Topics   Alcohol use: No   Drug use: No   Social History   Social History Narrative   She is recently widowed - her husband "Engineer, manufacturing systems" (also a patient of mine) died somewhat unexpectedly from complications of a GI bleed in December 2019)   Mother of 2.   He does not smoke or drink alcohol.   She tries to exercise, has been not as compliant as she had in the past.       OBJCTIVE -PE, EKG, labs   Wt Readings from Last 3 Encounters:  08/29/22 139 lb (63 kg)  03/28/22 136 lb (61.7 kg)  08/15/21 136 lb 12.8 oz (62.1 kg)    Physical Exam: BP (!) 140/78   Pulse 77   Ht 5' 3.5" (1.613 m)   Wt 139 lb (63 kg)   SpO2 97%   BMI 24.24 kg/m  =BP high 2/2 rushing in (running late)  Physical Exam Vitals reviewed.  Constitutional:      General: She is not in acute distress.    Appearance: Normal appearance. She is normal weight. She is not ill-appearing or toxic-appearing.     Comments: Well-nourished and  well-groomed healthy-appearing.  HENT:     Head: Normocephalic and atraumatic.  Neck:     Vascular: No carotid bruit.  Cardiovascular:     Rate and Rhythm: Normal rate and regular rhythm.     Pulses: Normal pulses.     Heart sounds: Murmur (Follow-up 1/6 SEM at RUSB-neck) heard.     No friction rub. No gallop.  Pulmonary:     Effort: Pulmonary effort is normal. No respiratory distress.     Breath sounds: No wheezing, rhonchi or rales.  Chest:     Chest wall: Tenderness present.  Musculoskeletal:        General: No swelling.     Cervical back: Normal range of motion and neck supple.  Skin:    General: Skin is warm and dry.  Neurological:     General: No focal deficit present.     Mental Status: She is alert and oriented to person, place, and time.     Gait: Gait normal.  Psychiatric:        Mood and Affect: Mood normal.        Behavior: Behavior normal.        Thought Content: Thought content normal.        Judgment: Judgment normal.     Comments: Very anxious still.  Perseverates on certain ideas..     Adult ECG Report N/a  Recent Labs:   [facility] Ref Range & Units 07/04/2022 Comments  Sodium 135 - 146 MMOL/L 140  Potassium 3.5 - 5.3 MMOL/L 4.2  Chloride 98 - 110 MMOL/L 103  CO2 23 - 30 MMOL/L 26  BUN 8 - 24 MG/DL 19  Glucose 70 - 99 MG/DL 139 High   Calcium 8.5 - 10.5 MG/DL 10.1  Phosphorus 2.5 - 4.5 MG/DL 3.8  Albumin 3.5 - 5.0 G/DL 4.8  Creatinine 0.50 - 1.50 MG/DL 1.06  Anion Gap 4 - 14 MMOL/L 11  Est. GFR >=60 ML/MIN/1.73 M*2 54 Low   Tot Vit D 25-OH 30 - 100 ng/mL 73  TSH 0.45 - 5.00 UIU/mL 2.19  Last recorded labs are from November 2021: TC 189, TG 90, HDL 49, LDL 123, A1c 7.3.  CR 1.29.  No results found for: "CREATININE", "BUN", "NA", "K", "CL", "CO2"   No results found for: "HGBA1C" No results found for: "TSH"  ==================================================  COVID-19 Education: The signs and symptoms of COVID-19 were discussed with  the patient and how to seek care for testing (follow up with PCP or arrange E-visit).    I spent a total of 22 minutes with the patient spent in direct patient consultation.  Additional time spent with chart review  / charting (studies, outside notes, etc): 15 min Total Time: 37 min  Current medicines are reviewed at length with the patient today.  (+/- concerns) N/A   Notice: This dictation was prepared with Dragon dictation along with smart phrase technology. Any transcriptional errors that result from this process are unintentional and may not be corrected upon review.  Studies Ordered:   No orders of the defined types were placed in this encounter.  Meds ordered this encounter  Medications   BYSTOLIC 20 MG TABS    Sig: Take 1 tablet (20 mg total) by mouth daily.    Dispense:  90 tablet    Refill:  3    Patient Instructions / Medication Changes & Studies & Tests Ordered   Patient Instructions  Medication Instructions:   No changes  *If you need a refill on your cardiac medications before your next appointment, please call your pharmacy*   Lab Work: No changes     Testing/Procedures:  Not needed  Follow-Up: At [facility], you and your health needs are our priority.  As part of our continuing mission to provide you with exceptional heart care, we have created designated Provider Care Teams.  These Care Teams include your primary Cardiologist (physician) and Advanced Practice Providers (APPs -  Physician Assistants and Nurse Practitioners) who all work together to provide you with the care you need, when you need it.     Your next appointment:   6 month(s)  The format for your next appointment:   In Person  Provider:   Glenetta Hew, MD        Glenetta Hew, M.D., M.S. Interventional Cardiologist   Pager # [phone] Phone # [phone] [address] [place], [place] [address]   Thank you for choosing Heartcare at [facility]!!

## 2022-08-29 NOTE — Patient Instructions (Signed)
Medication Instructions:   No changes  *If you need a refill on your cardiac medications before your next appointment, please call your pharmacy*   Lab Work: No changes     Testing/Procedures:  Not needed  Follow-Up: At [facility], you and your health needs are our priority.  As part of our continuing mission to provide you with exceptional heart care, we have created designated Provider Care Teams.  These Care Teams include your primary Cardiologist (physician) and Advanced Practice Providers (APPs -  Physician Assistants and Nurse Practitioners) who all work together to provide you with the care you need, when you need it.     Your next appointment:   6 month(s)  The format for your next appointment:   In Person  Provider:   Glenetta Hew, MD

## 2022-09-02 DIAGNOSIS — Z1152 Encounter for screening for COVID-19: Secondary | ICD-10-CM | POA: Diagnosis not present

## 2022-09-02 DIAGNOSIS — U071 COVID-19: Secondary | ICD-10-CM | POA: Diagnosis not present

## 2022-09-02 DIAGNOSIS — R509 Fever, unspecified: Secondary | ICD-10-CM | POA: Diagnosis not present

## 2022-09-08 DIAGNOSIS — R059 Cough, unspecified: Secondary | ICD-10-CM | POA: Diagnosis not present

## 2022-09-08 DIAGNOSIS — U071 COVID-19: Secondary | ICD-10-CM | POA: Diagnosis not present

## 2022-09-14 DIAGNOSIS — E7800 Pure hypercholesterolemia, unspecified: Secondary | ICD-10-CM | POA: Diagnosis not present

## 2022-09-14 DIAGNOSIS — I10 Essential (primary) hypertension: Secondary | ICD-10-CM | POA: Diagnosis not present

## 2022-09-14 DIAGNOSIS — N1831 Chronic kidney disease, stage 3a: Secondary | ICD-10-CM | POA: Diagnosis not present

## 2022-09-14 DIAGNOSIS — E118 Type 2 diabetes mellitus with unspecified complications: Secondary | ICD-10-CM | POA: Diagnosis not present

## 2022-09-14 DIAGNOSIS — L659 Nonscarring hair loss, unspecified: Secondary | ICD-10-CM | POA: Diagnosis not present

## 2022-09-14 LAB — LAB REPORT - SCANNED
A1c: 7.5
Albumin, Urine POC: 3
Creatinine, POC: 23.4 mg/dL
EGFR: 66
Microalb Creat Ratio: 13

## 2022-09-19 ENCOUNTER — Encounter: Payer: Self-pay | Admitting: Cardiology

## 2022-09-19 NOTE — Assessment & Plan Note (Signed)
BP on recheck today was 136 / 76 mmHg.  She is on a pretty stable regimen.  No changes for now.  Continue amlodipine 5 mg daily along with bisoprolol 20 mg daily.

## 2022-09-19 NOTE — Assessment & Plan Note (Signed)
With exception of the episode back in November likely associated with anxiety and stress from 4-year reunion of her husband's death, continue current dose of Bystolic.  Continue to refill Bystolic as tradename and not generic.

## 2022-09-19 NOTE — Assessment & Plan Note (Signed)
Remains on a Zetia plus low-dose Crestor.  Has not had lipids checked that I can tell for now 3 years.  Should be due to have labs checked by PCP if not already checked. Currently on metformin 500 mg twice daily and Jardiance 25 mg (split into 12.5 m BID)

## 2022-09-25 DIAGNOSIS — I10 Essential (primary) hypertension: Secondary | ICD-10-CM | POA: Diagnosis not present

## 2022-09-25 DIAGNOSIS — D329 Benign neoplasm of meninges, unspecified: Secondary | ICD-10-CM | POA: Diagnosis not present

## 2022-09-25 DIAGNOSIS — E1169 Type 2 diabetes mellitus with other specified complication: Secondary | ICD-10-CM | POA: Diagnosis not present

## 2022-09-25 DIAGNOSIS — Z0000 Encounter for general adult medical examination without abnormal findings: Secondary | ICD-10-CM | POA: Diagnosis not present

## 2022-10-31 DIAGNOSIS — E113311 Type 2 diabetes mellitus with moderate nonproliferative diabetic retinopathy with macular edema, right eye: Secondary | ICD-10-CM | POA: Diagnosis not present

## 2022-10-31 DIAGNOSIS — H2513 Age-related nuclear cataract, bilateral: Secondary | ICD-10-CM | POA: Diagnosis not present

## 2022-10-31 DIAGNOSIS — E113392 Type 2 diabetes mellitus with moderate nonproliferative diabetic retinopathy without macular edema, left eye: Secondary | ICD-10-CM | POA: Diagnosis not present

## 2022-11-07 DIAGNOSIS — G9389 Other specified disorders of brain: Secondary | ICD-10-CM | POA: Diagnosis not present

## 2022-11-07 DIAGNOSIS — D329 Benign neoplasm of meninges, unspecified: Secondary | ICD-10-CM | POA: Diagnosis not present

## 2022-11-29 ENCOUNTER — Telehealth: Payer: Self-pay

## 2022-11-29 DIAGNOSIS — I10 Essential (primary) hypertension: Secondary | ICD-10-CM | POA: Diagnosis not present

## 2022-11-29 NOTE — Patient Outreach (Signed)
  Care Coordination   In Person Provider Office Visit Note   11/29/2022 Name: Kathy [name] MRN: [id number] DOB: 10/30/1940  Kathy [name] is a 82 y.o. year old female who sees Deland Pretty, MD for primary care. I engaged with Casey Burkitt in the providers office today.  What matters to the patients health and wellness today?  None today    Goals Addressed             This Visit's Progress    Care Coordination Activities-No follow up required       Care Coordination Interventions: Advised patient to Annual Wellness exam. Discussed [facility] services and support. Assessed SDOH. Advised to discuss with primary care physician if services needed in the future.  Interventions Today    Flowsheet Row Most Recent Value  Chronic Disease   Chronic disease during today's visit Hypertension (HTN)  General Interventions   General Interventions Discussed/Reviewed General Interventions Discussed, Doctor Visits  Doctor Visits Discussed/Reviewed Doctor Visits Discussed  [place] Discussed/Reviewed Mental Health Discussed  Nutrition Interventions   Nutrition Discussed/Reviewed Decreasing salt  Pharmacy Interventions   Pharmacy Dicussed/Reviewed Pharmacy Topics Discussed  Safety Interventions   Safety Discussed/Reviewed Safety Discussed             SDOH assessments and interventions completed:  Yes  SDOH Interventions Today    Flowsheet Row Most Recent Value  SDOH Interventions   Housing Interventions Intervention Not Indicated  Transportation Interventions Intervention Not Indicated        Care Coordination Interventions:  Yes, provided   Follow up plan: No further intervention required.   Encounter Outcome:  Pt. Visit Completed   Jone Baseman, RN, MSN [place] [facility] Care Management Coordinator Direct Line [phone]

## 2022-11-29 NOTE — Patient Instructions (Signed)
Visit Information  Thank you for taking time to visit with me today. Please don't hesitate to contact me if I can be of assistance to you.   Following are the goals we discussed today:   Goals Addressed             This Visit's Progress    Care Coordination Activities-No follow up required       Care Coordination Interventions: Advised patient to Annual Wellness exam. Discussed [facility] services and support. Assessed SDOH. Advised to discuss with primary care physician if services needed in the future.  Interventions Today    Flowsheet Row Most Recent Value  Chronic Disease   Chronic disease during today's visit Hypertension (HTN)  General Interventions   General Interventions Discussed/Reviewed General Interventions Discussed, Doctor Visits  Doctor Visits Discussed/Reviewed Doctor Visits Discussed  [place] Discussed  Nutrition Interventions   Nutrition Discussed/Reviewed Decreasing salt  Pharmacy Interventions   Pharmacy Dicussed/Reviewed Pharmacy Topics Discussed  Safety Interventions   Safety Discussed/Reviewed Safety Discussed              If you are experiencing a Mental Health or [place] or need someone to talk to, please call the Suicide and Crisis Lifeline: 988   Patient verbalizes understanding of instructions and care plan provided today and agrees to view in [place]. Active MyChart status and patient understanding of how to access instructions and care plan via MyChart confirmed with patient.     The patient has been provided with contact information for the care management team and has been advised to call with any health related questions or concerns.   Jone Baseman, RN, MSN [place] [facility] Care Management Coordinator Direct Line [phone]

## 2022-12-26 DIAGNOSIS — I10 Essential (primary) hypertension: Secondary | ICD-10-CM | POA: Diagnosis not present

## 2022-12-26 DIAGNOSIS — E1169 Type 2 diabetes mellitus with other specified complication: Secondary | ICD-10-CM | POA: Diagnosis not present

## 2022-12-26 DIAGNOSIS — E7800 Pure hypercholesterolemia, unspecified: Secondary | ICD-10-CM | POA: Diagnosis not present

## 2023-01-08 DIAGNOSIS — H2513 Age-related nuclear cataract, bilateral: Secondary | ICD-10-CM | POA: Diagnosis not present

## 2023-01-08 DIAGNOSIS — E113392 Type 2 diabetes mellitus with moderate nonproliferative diabetic retinopathy without macular edema, left eye: Secondary | ICD-10-CM | POA: Diagnosis not present

## 2023-01-08 DIAGNOSIS — E113311 Type 2 diabetes mellitus with moderate nonproliferative diabetic retinopathy with macular edema, right eye: Secondary | ICD-10-CM | POA: Diagnosis not present

## 2023-01-08 DIAGNOSIS — H43823 Vitreomacular adhesion, bilateral: Secondary | ICD-10-CM | POA: Diagnosis not present

## 2023-02-20 DIAGNOSIS — E042 Nontoxic multinodular goiter: Secondary | ICD-10-CM | POA: Diagnosis not present

## 2023-02-20 DIAGNOSIS — M8580 Other specified disorders of bone density and structure, unspecified site: Secondary | ICD-10-CM | POA: Diagnosis not present

## 2023-02-27 ENCOUNTER — Ambulatory Visit: Payer: 59 | Attending: Cardiology | Admitting: Cardiology

## 2023-02-27 ENCOUNTER — Encounter: Payer: Self-pay | Admitting: Cardiology

## 2023-02-27 VITALS — BP 146/66 | HR 71 | Ht 64.0 in | Wt 134.6 lb

## 2023-02-27 DIAGNOSIS — I358 Other nonrheumatic aortic valve disorders: Secondary | ICD-10-CM | POA: Diagnosis not present

## 2023-02-27 DIAGNOSIS — R002 Palpitations: Secondary | ICD-10-CM

## 2023-02-27 DIAGNOSIS — E785 Hyperlipidemia, unspecified: Secondary | ICD-10-CM

## 2023-02-27 DIAGNOSIS — I10 Essential (primary) hypertension: Secondary | ICD-10-CM

## 2023-02-27 DIAGNOSIS — E1169 Type 2 diabetes mellitus with other specified complication: Secondary | ICD-10-CM

## 2023-02-27 NOTE — Patient Instructions (Signed)
Medication Instructions:  NONE  *If you need a refill on your cardiac medications before your next appointment, please call your pharmacy*   Lab Work: NONE  Testing/Procedures: NONE   Follow-Up: At [facility], you and your health needs are our priority.  As part of our continuing mission to provide you with exceptional heart care, we have created designated Provider Care Teams.  These Care Teams include your primary Cardiologist (physician) and Advanced Practice Providers (APPs -  Physician Assistants and Nurse Practitioners) who all work together to provide you with the care you need, when you need it.  Your next appointment:   6 month(s)  Provider:   Bryan Lemma, MD

## 2023-02-27 NOTE — Progress Notes (Signed)
Primary Care Provider: Merri Brunette, MD [facility] Cardiologist: Bryan Lemma, MD Electrophysiologist: None  Clinic Note: Chief Complaint  Patient presents with   Follow-up    Very stressed out this morning, had to drive herself-very anxious about driving.  Running late.   ===================================  ASSESSMENT/PLAN   Problem List Items Addressed This Visit       Cardiology Problems   Moderate essential hypertension - Primary (Chronic)    A bit high today, but otherwise has been pretty well-controlled.  At this point, we will simply continue current medications as she has been quite stable. She is on amlodipine 5 mg along with Bystolic 20 mg daily.      Relevant Medications   aspirin EC 325 MG tablet     Other   Heart palpitations (Chronic)    Well-controlled on Bystolic.  As her anxiety is controlled with Zoloft, hopefully this will also continue to improve.      Relevant Orders   EKG 12-Lead (Completed)   Dyslipidemia associated with type 2 diabetes mellitus (HCC) (Chronic)    Based on her recent labs, we have tried Crestor, but she did not tolerate it.  She is now taking Nexletol.  Hopefully she will tolerate this.  Otherwise, we will need to consider other options.  If she is able to tolerate Nexletol, we could consider trying to combine Zetia and Nexletol to Nexlizet.  Labs will be followed up by PCP.  She is on Jardiance  and diabetes for diabetes and has had some improvement of her A1c.      Relevant Medications   aspirin EC 325 MG tablet   metformin (FORTAMET) 1000 MG (OSM) 24 hr tablet   Aortic heart murmur (Chronic)    Aortic sclerosis.  Benign      ===================================  HPI:    Kathy [name] is a 82 y.o. female followed for labile HTN (with multiple medicine intolerances-only tolerates tradename medications), recurrent palpitations, HLD and DM-2 who presents today for 6 months follow-up at the request of  Merri Brunette, MD.  Kathy [name] was last seen on 08/29/2022-doing relatively well with no major cardiac issues.  Was started to fixate now on her thyroid nodules.  They are discussing whether or not to perform biopsies.  However they decided to only perform biopsies if the size is increased.  She had some palpitations around the anniversary of her husband passing, but otherwise been doing relatively well.  Off and on 1 or 2-second fluttering sensations.  But nothing prolonged.  Recent Hospitalizations: None  Reviewed  CV studies:    The following studies were reviewed today: (if available, images/films reviewed: From Epic Chart or Care Everywhere) None:  Interval History:   Kathy [name] returns here today for 94-month follow-up.  She said that actually she caught COVID shortly after I saw her.  But she did relatively well with that.  Mostly had coughing and congestion.  Really did well.  She is due for a thyroid biopsy (FNA) and is waiting for the appointment = Little bit anxious.  They are planning to follow-up her meningioma with MRIs every 2 years.  She said her blood pressure is usually at home been anywhere from 120/70 to 117/70.  The last 2 medical visits have been roughly in that zone as well.  Today she was very stressed out.  She usually has a friend that brings her to clinic visits, but today she had to drive herself and was running  late.  She was very anxious, concerned about accidents.  For the most part of her BP has been well-controlled.  Palpitations are also pale controlled she has not really had any in the last 2 to 3 months since things are stabilized. She mentioned that her PCP is started on Nexletol for her lipids and she seems to be doing okay with it so far.  She stopped taking Crestor because of cramps.  CV Review of Symptoms (Summary):no chest pain or dyspnea on exertion positive for - no palpitations in greater than 2 months.  BP also well-controlled. negative  for - edema, orthopnea, paroxysmal nocturnal dyspnea, rapid heart rate, shortness of breath, or syncope or near syncope, TIA/amaurosis fugax, claudication  REVIEWED OF SYSTEMS   Pertinent Noncardiac Symptoms: Intermittent headaches, better.  Seems to be less perseverating about the meningioma and thyroid disease; mild joint pains.  She finally discussed concerns of depression and anxiety with her PCP.  Tried Zoloft, but was unable to tolerate the generic version.  Now try to get the tradename preapproved.  I have reviewed and (if needed) personally updated the patient's problem list, medications, allergies, past medical and surgical history, social and family history.   PAST MEDICAL HISTORY   Past Medical History:  Diagnosis Date   DM type 2 (diabetes mellitus, type 2) (HCC)    Dyslipidemia    Heart palpitations    No documented Arrhythmia   HTN (hypertension), benign    Prior History of Presumed Hypertensive Cardiomyopathy with LVH - not confrimed by Echo in 2010 & 2015    PAST SURGICAL HISTORY   Past Surgical History:  Procedure Laterality Date   TRANSTHORACIC ECHOCARDIOGRAM  January 2015, August 2018   a) 2015 - NormalLV size & function, EF 60-65% with Gr 1 DD, mild Ao Sclerosis with mild-mod AI;; b) no significant change    MEDICATIONS/ALLERGIES   Current Meds  Medication Sig   amLODipine (NORVASC) 5 MG tablet Take 1 tablet (5 mg total) by mouth daily. ( morning)   aspirin EC 325 MG tablet Take 325 mg by mouth as directed.   BYSTOLIC 20 MG TABS Take 1 tablet (20 mg total) by mouth daily.   Cholecalciferol (VITAMIN D3) 2000 UNITS capsule Take 5,000 Units by mouth daily.    Coenzyme Q10 (CO Q 10) 100 MG CAPS Take 300 mg by mouth daily.    ezetimibe (ZETIA) 10 MG tablet Take 10 mg by mouth daily.   hydrOXYzine (ATARAX/VISTARIL) 25 MG tablet Take 25 mg  One tablet by mouth once a day or as needed   JARDIANCE 25 MG TABS tablet Take 12.5 mg by mouth 2 (two) times daily.     metformin (FORTAMET) 1000 MG (OSM) 24 hr tablet Take 1,000 mg by mouth 2 (two) times daily with a meal.   Multiple Vitamin (MULTIVITAMIN) tablet Take 1 tablet by mouth daily.   Omega-3 Fatty Acids (EMULSIFIED OMEGA-3) 409-8119 MG/15ML LIQD Takes 1 tablespon by mouth daily.    Allergies  Allergen Reactions   Corn-Containing Products Anaphylaxis   Ibuprofen Shortness Of Breath   Peanuts [Peanut Oil] Anaphylaxis   Penicillins Shortness Of Breath   Shellfish Allergy Anaphylaxis   Soy Allergy Anaphylaxis    Only sometimes    SOCIAL HISTORY/FAMILY HISTORY   Reviewed in Epic:  Pertinent findings: non-smoker, No EtOH Social History   Social History Narrative   She is recently widowed - her husband "Programmer, applications" (also a patient of mine) died somewhat unexpectedly from complications of a  GI bleed in December 2019)   Mother of 2.   He does not smoke or drink alcohol.   She tries to exercise, has been not as compliant as she had in the past.       OBJCTIVE -PE, EKG, labs   Wt Readings from Last 3 Encounters:  02/27/23 134 lb 9.6 oz (61.1 kg)  08/29/22 139 lb (63 kg)  03/28/22 136 lb (61.7 kg)    Physical Exam: BP (!) 146/66   Pulse 71   Ht 5\' 4"  (1.626 m)   Wt 134 lb 9.6 oz (61.1 kg)   BMI 23.10 kg/m  => Initial BP on arrival was 176/72-after she had time to relax we rechecked it-was 146/66.  At home this is still high for her.  Physical Exam Vitals reviewed.  Constitutional:      General: She is not in acute distress.    Appearance: Normal appearance. She is normal weight. She is not ill-appearing or toxic-appearing.  HENT:     Head: Normocephalic and atraumatic.  Neck:     Vascular: No carotid bruit or JVD.  Cardiovascular:     Rate and Rhythm: Normal rate and regular rhythm. No extrasystoles are present.    Pulses: Normal pulses.     Heart sounds: S1 normal and S2 normal. Murmur (1/6 SEM at RUSB) heard.     No friction rub. No gallop.  Pulmonary:     Effort: Pulmonary  effort is normal. No respiratory distress.     Breath sounds: Normal breath sounds. No stridor. No wheezing or rales.  Chest:     Chest wall: Tenderness present.  Musculoskeletal:        General: No swelling. Normal range of motion.     Cervical back: Normal range of motion and neck supple.  Skin:    General: Skin is warm and dry.  Neurological:     General: No focal deficit present.     Mental Status: She is alert and oriented to person, place, and time. Mental status is at baseline.     Cranial Nerves: No cranial nerve deficit.  Psychiatric:        Mood and Affect: Mood normal.        Behavior: Behavior normal.        Thought Content: Thought content normal.     Comments: Still has some anxiety but seems to be stable.;  Was very worked up today when she arrived.  Had to drive herself, and was running late.     Adult ECG Report  Rate: 71 ;  Rhythm: normal sinus rhythm and normal axis, intervals& duration ;   Narrative Interpretation: n/A  Recent Labs:   09/14/2022 from PCP: TC 197, TG 130, HDL 41, LDL 123.;  A1c 7.5; TSH 2.86; WBC 4.9, H/H12.8/39.3, PLT 256.; Na 141, K 4.0, Cl 102, CO2 20, BUN/Cr 12/0.89, Ca 9.s, ALT 13, AST 24, AP 49. 12/26/2022: A1c 7.3. ================================================== I spent a total of 42 minutes with the patient spent in direct patient consultation.  Additional time spent with chart review  / charting (studies, outside notes, etc): 14 min Total Time: 56 min  Current medicines are reviewed at length with the patient today.  (+/- concerns) N/A  Notice: This dictation was prepared with Dragon dictation along with smart phrase technology. Any transcriptional errors that result from this process are unintentional and may not be corrected upon review.  Studies Ordered:   Orders Placed This Encounter  Procedures   EKG  12-Lead   No orders of the defined types were placed in this encounter.   Patient Instructions / Medication Changes &  Studies & Tests Ordered   Patient Instructions  Medication Instructions:  NONE  *If you need a refill on your cardiac medications before your next appointment, please call your pharmacy*   Lab Work: NONE  Testing/Procedures: NONE   Follow-Up: At [facility], you and your health needs are our priority.  As part of our continuing mission to provide you with exceptional heart care, we have created designated Provider Care Teams.  These Care Teams include your primary Cardiologist (physician) and Advanced Practice Providers (APPs -  Physician Assistants and Nurse Practitioners) who all work together to provide you with the care you need, when you need it.  Your next appointment:   6 month(s)  Provider:   Bryan Lemma, MD           Marykay Lex, MD, MS Bryan Lemma, M.D., M.S. Interventional Cardiologist  [facility]  Pager # [phone] Phone # [phone] [address] [place], [place] [address]   Thank you for choosing [facility] [place]!!

## 2023-03-04 ENCOUNTER — Encounter: Payer: Self-pay | Admitting: Cardiology

## 2023-03-04 NOTE — Assessment & Plan Note (Signed)
Well-controlled on Bystolic.  As her anxiety is controlled with Zoloft, hopefully this will also continue to improve.

## 2023-03-04 NOTE — Assessment & Plan Note (Signed)
Based on her recent labs, we have tried Crestor, but she did not tolerate it.  She is now taking Nexletol.  Hopefully she will tolerate this.  Otherwise, we will need to consider other options.  If she is able to tolerate Nexletol, we could consider trying to combine Zetia and Nexletol to Nexlizet.  Labs will be followed up by PCP.  She is on Jardiance  and diabetes for diabetes and has had some improvement of her A1c.

## 2023-03-04 NOTE — Assessment & Plan Note (Signed)
Aortic sclerosis.  Benign

## 2023-03-04 NOTE — Assessment & Plan Note (Signed)
A bit high today, but otherwise has been pretty well-controlled.  At this point, we will simply continue current medications as she has been quite stable. She is on amlodipine 5 mg along with Bystolic 20 mg daily.

## 2023-03-05 DIAGNOSIS — H43823 Vitreomacular adhesion, bilateral: Secondary | ICD-10-CM | POA: Diagnosis not present

## 2023-03-05 DIAGNOSIS — G245 Blepharospasm: Secondary | ICD-10-CM | POA: Diagnosis not present

## 2023-03-05 DIAGNOSIS — H25813 Combined forms of age-related cataract, bilateral: Secondary | ICD-10-CM | POA: Diagnosis not present

## 2023-03-20 DIAGNOSIS — E1169 Type 2 diabetes mellitus with other specified complication: Secondary | ICD-10-CM | POA: Diagnosis not present

## 2023-03-20 DIAGNOSIS — E7800 Pure hypercholesterolemia, unspecified: Secondary | ICD-10-CM | POA: Diagnosis not present

## 2023-03-20 DIAGNOSIS — I10 Essential (primary) hypertension: Secondary | ICD-10-CM | POA: Diagnosis not present

## 2023-03-27 DIAGNOSIS — M791 Myalgia, unspecified site: Secondary | ICD-10-CM | POA: Diagnosis not present

## 2023-03-27 DIAGNOSIS — E7800 Pure hypercholesterolemia, unspecified: Secondary | ICD-10-CM | POA: Diagnosis not present

## 2023-03-27 DIAGNOSIS — E1169 Type 2 diabetes mellitus with other specified complication: Secondary | ICD-10-CM | POA: Diagnosis not present

## 2023-04-03 DIAGNOSIS — E041 Nontoxic single thyroid nodule: Secondary | ICD-10-CM | POA: Diagnosis not present

## 2023-04-18 DIAGNOSIS — E1169 Type 2 diabetes mellitus with other specified complication: Secondary | ICD-10-CM | POA: Diagnosis not present

## 2023-04-18 DIAGNOSIS — E7800 Pure hypercholesterolemia, unspecified: Secondary | ICD-10-CM | POA: Diagnosis not present

## 2023-05-22 DIAGNOSIS — E7800 Pure hypercholesterolemia, unspecified: Secondary | ICD-10-CM | POA: Diagnosis not present

## 2023-05-22 DIAGNOSIS — M791 Myalgia, unspecified site: Secondary | ICD-10-CM | POA: Diagnosis not present

## 2023-05-22 DIAGNOSIS — E1169 Type 2 diabetes mellitus with other specified complication: Secondary | ICD-10-CM | POA: Diagnosis not present

## 2023-08-24 ENCOUNTER — Ambulatory Visit: Payer: 59 | Admitting: Cardiology

## 2023-08-28 DIAGNOSIS — E042 Nontoxic multinodular goiter: Secondary | ICD-10-CM | POA: Diagnosis not present

## 2023-08-28 DIAGNOSIS — M8580 Other specified disorders of bone density and structure, unspecified site: Secondary | ICD-10-CM | POA: Diagnosis not present

## 2023-08-31 DIAGNOSIS — M79672 Pain in left foot: Secondary | ICD-10-CM | POA: Diagnosis not present

## 2023-09-03 DIAGNOSIS — E042 Nontoxic multinodular goiter: Secondary | ICD-10-CM | POA: Diagnosis not present

## 2023-09-03 DIAGNOSIS — E041 Nontoxic single thyroid nodule: Secondary | ICD-10-CM | POA: Diagnosis not present

## 2023-09-03 DIAGNOSIS — M8580 Other specified disorders of bone density and structure, unspecified site: Secondary | ICD-10-CM | POA: Diagnosis not present

## 2023-09-08 ENCOUNTER — Other Ambulatory Visit: Payer: Self-pay | Admitting: Cardiology

## 2023-09-17 NOTE — Progress Notes (Signed)
 Cardiology Office Note:  .   Date:  09/20/2023  ID:  Kathy [name], Kathy [name] [birth year], MRN [id number] PCP: Clarice Nottingham, MD  [facility] Providers Cardiologist: Alm Clay, MD }   History of Present Illness: .   Kathy [name] is a 82 y.o. female with known history of hypertension, palpitations, dyslipidemia, type 2 diabetes, and aortic sclerosis.  Last seen by Dr. Clay on 02/27/2023.  At that time she was depressed and anxious.  She was advised to talk with her primary care on antidepressants.  Since being seen last the patient has been doing well.  She continues to have some mild muscle cramps has been placed on Nexletol by her primary care provider.  Labs are good to be drawn in the next couple of weeks for annual values.  Dr. Clarice is following this currently.  She has been medically compliant.  She is being followed by an endocrinologist for diabetes, she has also had a recent MRI and she was found to have a benign brain tumor which had been there approximately 16 years.   She would like to become more physically active.  And she is joining a gym near her home and plans to attend as [employer] is offering this to her free of charge.  She offers no cardiac complaint of chest pain, dyspnea on exertion, or fatigue.  She would like to regain some muscle strength as she had been used to walking 5 miles daily.  ROS: As above otherwise negative  Studies Reviewed: SABRA   EKG Interpretation Date/Time:  Thursday September 20 2023 10:17:30 EST Ventricular Rate:  74 PR Interval:  140 QRS Duration:  70 QT Interval:  406 QTC Calculation: 450 R Axis:   -29  Text Interpretation: Normal sinus rhythm Minimal voltage criteria for LVH, may be normal variant ( R in aVL ) No previous ECGs available Confirmed by Jerilynn Collar [phone]) on 09/20/2023 10:48:38 AM     Physical Exam:   VS:  BP (!) 166/74 (BP Location: Left Arm, Patient Position: Sitting)   Pulse 74   Ht 5' 3.5 (1.613  m)   Wt 134 lb 12.8 oz (61.1 kg)   BMI 23.50 kg/m    Wt Readings from Last 3 Encounters:  09/20/23 134 lb 12.8 oz (61.1 kg)  02/27/23 134 lb 9.6 oz (61.1 kg)  08/29/22 139 lb (63 kg)    GEN: Well nourished, well developed in no acute distress NECK: No JVD; No carotid bruits CARDIAC: RRR, soft 1/6 systolic murmurs, rubs, gallops RESPIRATORY:  Clear to auscultation without rales, wheezing or rhonchi  ABDOMEN: Soft, non-tender, non-distended EXTREMITIES:  No edema; No deformity   ASSESSMENT AND PLAN: .    Hypertension: Blood pressure is not controlled today.  I did recheck it and his come down to 158/62.  She remains on amlodipine  5 mg daily.  She states that she normally has whitecoat syndrome at home it is normally much lower.  Will not treat this at this time.  If she has persistent elevation of her blood pressure at other visits may need to consider changing her amlodipine  to 7.5 mg daily.  No labs are drawn today as they are being drawn in the next couple weeks by primary care.  2.  Hypercholesterolemia: Being followed by primary care doctor Pharr.  Is recently been started on Nexletol continues on Zetia.  Goal of LDL less than 100 with known diabetes and hypertension.    3.  Type 2 diabetes:  Followed by endocrinologist and primary care.          Signed, Lamarr HERO. Jerilynn CHOL, ANP, AACC

## 2023-09-20 ENCOUNTER — Encounter: Payer: Self-pay | Admitting: Adult Health

## 2023-09-20 ENCOUNTER — Ambulatory Visit: Payer: 59 | Attending: Adult Health | Admitting: Adult Health

## 2023-09-20 VITALS — BP 166/74 | HR 74 | Ht 63.5 in | Wt 134.8 lb

## 2023-09-20 DIAGNOSIS — I10 Essential (primary) hypertension: Secondary | ICD-10-CM | POA: Diagnosis not present

## 2023-09-20 DIAGNOSIS — I358 Other nonrheumatic aortic valve disorders: Secondary | ICD-10-CM

## 2023-09-20 DIAGNOSIS — E7800 Pure hypercholesterolemia, unspecified: Secondary | ICD-10-CM

## 2023-09-20 NOTE — Patient Instructions (Signed)
 Medication Instructions:  NO CHANGE    Lab Work: NONE    Testing/Procedures: NONE   Follow-Up: At [facility], you and your health needs are our priority.  As part of our continuing mission to provide you with exceptional heart care, we have created designated Provider Care Teams.  These Care Teams include your primary Cardiologist (physician) and Advanced Practice Providers (APPs -  Physician Assistants and Nurse Practitioners) who all work together to provide you with the care you need, when you need it.    Your next appointment:   6 month(s)    Provider:   Alm Clay, MD

## 2023-09-27 DIAGNOSIS — I10 Essential (primary) hypertension: Secondary | ICD-10-CM | POA: Diagnosis not present

## 2023-09-27 DIAGNOSIS — E118 Type 2 diabetes mellitus with unspecified complications: Secondary | ICD-10-CM | POA: Diagnosis not present

## 2023-10-01 DIAGNOSIS — Z0000 Encounter for general adult medical examination without abnormal findings: Secondary | ICD-10-CM | POA: Diagnosis not present

## 2023-10-01 DIAGNOSIS — R053 Chronic cough: Secondary | ICD-10-CM | POA: Diagnosis not present

## 2023-10-01 DIAGNOSIS — E1169 Type 2 diabetes mellitus with other specified complication: Secondary | ICD-10-CM | POA: Diagnosis not present

## 2023-10-01 DIAGNOSIS — D329 Benign neoplasm of meninges, unspecified: Secondary | ICD-10-CM | POA: Diagnosis not present

## 2023-10-29 DIAGNOSIS — M79671 Pain in right foot: Secondary | ICD-10-CM | POA: Diagnosis not present

## 2023-10-29 DIAGNOSIS — S82891A Other fracture of right lower leg, initial encounter for closed fracture: Secondary | ICD-10-CM | POA: Diagnosis not present

## 2023-10-30 DIAGNOSIS — M25571 Pain in right ankle and joints of right foot: Secondary | ICD-10-CM | POA: Diagnosis not present

## 2023-10-30 DIAGNOSIS — S93491A Sprain of other ligament of right ankle, initial encounter: Secondary | ICD-10-CM | POA: Diagnosis not present

## 2023-12-03 DIAGNOSIS — S93491D Sprain of other ligament of right ankle, subsequent encounter: Secondary | ICD-10-CM | POA: Diagnosis not present

## 2023-12-08 ENCOUNTER — Other Ambulatory Visit: Payer: Self-pay | Admitting: Cardiology

## 2024-02-19 DIAGNOSIS — M8580 Other specified disorders of bone density and structure, unspecified site: Secondary | ICD-10-CM | POA: Diagnosis not present

## 2024-02-19 DIAGNOSIS — E1165 Type 2 diabetes mellitus with hyperglycemia: Secondary | ICD-10-CM | POA: Diagnosis not present

## 2024-02-19 DIAGNOSIS — E042 Nontoxic multinodular goiter: Secondary | ICD-10-CM | POA: Diagnosis not present

## 2024-02-25 DIAGNOSIS — E042 Nontoxic multinodular goiter: Secondary | ICD-10-CM | POA: Diagnosis not present

## 2024-03-03 ENCOUNTER — Encounter: Payer: Self-pay | Admitting: Cardiology

## 2024-03-03 ENCOUNTER — Ambulatory Visit: Attending: Cardiology | Admitting: Cardiology

## 2024-03-03 VITALS — BP 138/62 | HR 70 | Ht 63.5 in | Wt 134.6 lb

## 2024-03-03 DIAGNOSIS — E785 Hyperlipidemia, unspecified: Secondary | ICD-10-CM

## 2024-03-03 DIAGNOSIS — E1169 Type 2 diabetes mellitus with other specified complication: Secondary | ICD-10-CM

## 2024-03-03 DIAGNOSIS — I10 Essential (primary) hypertension: Secondary | ICD-10-CM | POA: Diagnosis not present

## 2024-03-03 NOTE — Patient Instructions (Signed)
 Medication Instructions:   No changes *If you need a refill on your cardiac medications before your next appointment, please call your pharmacy*  Other Instructions  It is okay to use Ozempic  - keep well hydrate  Lab Work: Not needed   Testing/Procedures:  Not needed  Follow-Up: At [facility], you and your health needs are our priority.  As part of our continuing mission to provide you with exceptional heart care, we have created designated Provider Care Teams.  These Care Teams include your primary Cardiologist (physician) and Advanced Practice Providers (APPs -  Physician Assistants and Nurse Practitioners) who all work together to provide you with the care you need, when you need it.     Your next appointment:   6 month(s)  The format for your next appointment:   In Person  Provider:   Marlana Silvan, NP or Katlyn West, NP      Then, Randene Bustard, MD will plan to see you again in 12 month(s).   Other Instructions  It is okay to use Ozempic  - keep well hydrate

## 2024-03-03 NOTE — Progress Notes (Unsigned)
 Cardiology Office Note:  .   Date:  03/07/2024  ID:  Kathy [name] Nov 16, 1940, MRN [id number] PCP: Imelda Man, MD  [facility] Providers Cardiologist:  Randene Bustard, MD     Dr. Marlis Simper  Chief Complaint  Patient presents with   Follow-up   Hypertension    Patient Profile: .     Kathy [name] is a 83 y.o. female  with a PMH notable for Labile HTN, HLD who presents here for 6 month f/u at the request of Imelda Man, MD.  She has been followed for her labile blood pressure mostly ever since her husband Smitty was being followed by me for severe CAD and ischemic cardiomyopathy.  He died several years ago but she is continue to follow-up.  She has multiple medication intolerances, especially with different types of generic formulations . -This is a usually prolonged because she has multiple potentially noncardiac issues that she usually likes to at least bring up with me and discuss to hear my opinion or advice.  Today's visit was 29 minutes.    Kathy [name] was last seen on September 20, 2023 by Friddie Jetty, NP-started on Nexletol by PCP mom was noticing some mild muscle cramps.  Now being followed by Dr. Imelda Man.  Stable benign brain tumor.  More physically active, joined a gym.  No other complaints.  BP was 166/74 likely related to whitecoat syndrome.  No changes made.  Subjective  Discussed the use of AI scribe software for clinical note transcription with the patient, who gave verbal consent to proceed.  History of Present Illness History of Present Illness Kathy [name] is an 83 year old female with diabetes and hypertension who presents for a follow-up regarding her diabetes management and blood pressure control.  She has been managing her diabetes with Jardiance and metformin for several years. Recently, her endocrinologist suggested transitioning from metformin to Ozempic. She plans to trial Ozempic for four months  to assess its compatibility with her body. She is motivated to reach a point where she may not need medication.  She has been experiencing palpitations, which she attributes to increased coffee consumption, particularly dark roast. She has since reduced her intake to one cup of coffee per day to manage these symptoms. No chest pain, pressure, tightness, or heart racing.  She has a history of hypertension and is currently taking amlodipine . Her blood pressure readings have been normal during recent visits to her primary care doctor and endocrinologist. She is participating in a program that involves monitoring her blood pressure at home and sending the data via an app. She prefers her medications in a 90-day supply to avoid frequent refills.  She experienced a fall recently, resulting in an ankle injury. She was treated at a local sports center where she received an ankle boot and was advised that bruising would take about six weeks to heal. She reports no ongoing pain from this incident.  She mentions experiencing headaches over the past five months, which she describes as mild but disruptive. She is concerned about these headaches as she had not experienced them previously.  Mild chest wall pain with L arm movement.  Cardiovascular ROS: no chest pain or dyspnea on exertion positive for - palpitations with too much coffee; occasional HA negative for - edema, irregular heartbeat, orthopnea, paroxysmal nocturnal dyspnea, rapid heart rate, shortness of breath, or syncope,near syncope, TIA/Amaurosis fugax; claudication  ROS:  Review of Systems - Negative except Sx  above   Objective   Current Meds  Medication Sig   amLODipine  (NORVASC ) 5 MG tablet Take 1 tablet (5 mg total) by mouth daily. ( morning)   aspirin EC 325 MG tablet Take 325 mg by mouth as directed.   BYSTOLIC  20 MG TABS TAKE 1 TABLET BY MOUTH EVERY DAY   Coenzyme Q10 (CO Q 10) 100 MG CAPS Take 300 mg by mouth daily.    ezetimibe  (ZETIA) 10 MG tablet Take 10 mg by mouth daily.   JARDIANCE 25 MG TABS tablet Take 12.5 mg by mouth 2 (two) times daily.  (Patient taking differently: Take 25 mg by mouth daily.)   metformin (FORTAMET) 1000 MG (OSM) 24 hr tablet Take 1,000 mg by mouth 2 (two) times daily with a meal.   NEXLETOL 180 MG TABS Take 180 mg by mouth daily at 6 (six) AM.   Omega-3 Fatty Acids (EMULSIFIED OMEGA-3) [phone] MG/15ML LIQD Takes 1 tablespon by mouth daily.    hydrOXYzine  (ATARAX /VISTARIL ) 25 MG tablet Take 25 mg  One tablet by mouth once a day or as needed    Studies Reviewed: Aaron Aas        No new studies  Most recent labs from Dr. Estrella Hench office 09/27/2023: TC 177, TG 94, HDL 57 LDL 103  Risk Assessment/Calculations:          Physical Exam:   VS: BP #09/18/1952/64; #2 BP 138/62   Pulse 70   Ht 5' 3.5 (1.613 m)   Wt 134 lb 9.6 oz (61.1 kg)   SpO2 97%   BMI 23.47 kg/m    Wt Readings from Last 3 Encounters:  03/03/24 134 lb 9.6 oz (61.1 kg)  09/20/23 134 lb 12.8 oz (61.1 kg)  02/27/23 134 lb 9.6 oz (61.1 kg)    GEN: Well nourished, well developed in no acute distress; healthy appearing NECK: No JVD; No carotid bruits CARDIAC: Normal S1, S2; RRR, no murmurs, rubs, gallops RESPIRATORY:  Clear to auscultation without rales, wheezing or rhonchi ; nonlabored, good air movement. ABDOMEN: Soft, non-tender, non-distended EXTREMITIES:  No edema; No deformity    ASSESSMENT AND PLAN: .    Problem List Items Addressed This Visit       Cardiology Problems   Moderate essential hypertension - Primary (Chronic)   Hypertension managed with home monitoring. Normal readings at other visits.  Emphasized monitoring and adjusting amlodipine  if elevated. Discussed potential blood pressure reduction with Ozempic-induced weight loss. - Monitor blood pressure at home. -For now continue current regimen of amlodipine  5 mg daily, and Bystolic  20 mg daily (changed prescription to 90 days) - Adjust amlodipine  dosage  if blood pressure remains elevated. => Currently taking 5 mg amlodipine  daily,  - For sustained blood pressure greater than 160 mmHg, she can take an additional 1/2 tab. - Recheck of blood pressure before leaving the office showed notable improvement in pressures as documented above..        Other   Dyslipidemia associated with type 2 diabetes mellitus (HCC) (Chronic)   LDL relatively stable at 103 on current dose of 10 mg Zetia plus Nexletol 180 mg daily Statin intolerant, as long as we keep her LDL around 100, in the absence of any CAD simply continue with current medications. -Consider combining ezetimibe and Nexletol to Nexlizet 10-180 mg   Long-standing diabetes managed with Jardiance and metformin. Transition to Ozempic planned for weight loss, improved cardiac outcomes, and better blood pressure control. Emphasized hydration due to potential dehydration with Ozempic. She  will monitor response over four months to reduce medication dependency. - Continue Jardiance which she is actually taking 25 mg once daily as opposed to 12.5 mg twice daily for ease of taking - I am in agreement that she should initiate Ozempic as per endocrinologist's plan. - Monitor response to Ozempic over four months. - Ensure adequate hydration.          Follow-Up: Return in about 6 months (around 09/02/2024) for Alternate 6 month follow-up with APP & MD. => If possible, at the 72-month follow-up, could consider alternating annual visits with APP and MD.  Total time spent: 29 min spent with patient + 8 min spent charting = 37 min     Signed, Arleen Lacer, MD, MS Randene Bustard, M.D., M.S. Interventional Chartered certified accountant  Pager # [phone]

## 2024-03-07 ENCOUNTER — Encounter: Payer: Self-pay | Admitting: Cardiology

## 2024-03-07 NOTE — Assessment & Plan Note (Signed)
 Hypertension managed with home monitoring. Normal readings at other visits.  Emphasized monitoring and adjusting amlodipine  if elevated. Discussed potential blood pressure reduction with Ozempic-induced weight loss. - Monitor blood pressure at home. -For now continue current regimen of amlodipine  5 mg daily, and Bystolic  20 mg daily (changed prescription to 90 days) - Adjust amlodipine  dosage if blood pressure remains elevated. => Currently taking 5 mg amlodipine  daily,  - For sustained blood pressure greater than 160 mmHg, she can take an additional 1/2 tab. - Recheck of blood pressure before leaving the office showed notable improvement in pressures as documented above.Kathy [name]

## 2024-03-07 NOTE — Assessment & Plan Note (Signed)
 LDL relatively stable at 103 on current dose of 10 mg Zetia plus Nexletol 180 mg daily Statin intolerant, as long as we keep her LDL around 100, in the absence of any CAD simply continue with current medications. -Consider combining ezetimibe and Nexletol to Nexlizet 10-180 mg   Long-standing diabetes managed with Jardiance and metformin. Transition to Ozempic planned for weight loss, improved cardiac outcomes, and better blood pressure control. Emphasized hydration due to potential dehydration with Ozempic. She will monitor response over four months to reduce medication dependency. - Continue Jardiance which she is actually taking 25 mg once daily as opposed to 12.5 mg twice daily for ease of taking - I am in agreement that she should initiate Ozempic as per endocrinologist's plan. - Monitor response to Ozempic over four months. - Ensure adequate hydration.

## 2024-04-07 DIAGNOSIS — M8589 Other specified disorders of bone density and structure, multiple sites: Secondary | ICD-10-CM | POA: Diagnosis not present

## 2024-04-25 DIAGNOSIS — E042 Nontoxic multinodular goiter: Secondary | ICD-10-CM | POA: Diagnosis not present

## 2024-04-25 DIAGNOSIS — E1165 Type 2 diabetes mellitus with hyperglycemia: Secondary | ICD-10-CM | POA: Diagnosis not present

## 2024-04-25 DIAGNOSIS — M8580 Other specified disorders of bone density and structure, unspecified site: Secondary | ICD-10-CM | POA: Diagnosis not present

## 2024-06-25 DIAGNOSIS — E1165 Type 2 diabetes mellitus with hyperglycemia: Secondary | ICD-10-CM | POA: Diagnosis not present

## 2024-06-25 DIAGNOSIS — M8580 Other specified disorders of bone density and structure, unspecified site: Secondary | ICD-10-CM | POA: Diagnosis not present

## 2024-06-25 DIAGNOSIS — E042 Nontoxic multinodular goiter: Secondary | ICD-10-CM | POA: Diagnosis not present

## 2024-07-08 DIAGNOSIS — E1165 Type 2 diabetes mellitus with hyperglycemia: Secondary | ICD-10-CM | POA: Diagnosis not present

## 2024-07-09 DIAGNOSIS — R92323 Mammographic fibroglandular density, bilateral breasts: Secondary | ICD-10-CM | POA: Diagnosis not present

## 2024-07-09 DIAGNOSIS — Z1231 Encounter for screening mammogram for malignant neoplasm of breast: Secondary | ICD-10-CM | POA: Diagnosis not present

## 2024-08-26 ENCOUNTER — Ambulatory Visit: Admitting: Cardiology

## 2024-09-14 NOTE — Progress Notes (Deleted)
 "  Cardiology Office Note    Date:  09/14/2024  ID:  Kathy, [name] 1941/08/21, MRN [id number] PCP:  Clarice Nottingham, MD  Cardiologist:  Alm Clay, MD  Electrophysiologist:  None   Chief Complaint: ***  History of Present Illness: .    Kathy [name] is a 83 y.o. female with visit-pertinent history of labile hypertension, stable benign brain tumor, type 2 diabetes mellitus and hyperlipidemia.  Patient was last seen in clinic by Dr. Clay on 03/03/2024 for follow-up.  She had been experiencing palpitations which she attributed to increased coffee consumption, had reduced her intake of 1 cup of coffee per day to manage symptoms.  She had mild chest wall pain with left arm movements.  Low blood pressure she was continued on amlodipine  5 mg daily, Bystolic  20 mg daily.  This to be the patient presents for follow-up.  She reports that she   Hypertension: Blood pressure today Hyperlipidemia: Last lipid profile indicated Type 2 diabetes mellitus:   Labwork independently reviewed:   ROS: .   *** denies chest pain, shortness of breath, lower extremity edema, fatigue, palpitations, melena, hematuria, hemoptysis, diaphoresis, weakness, presyncope, syncope, orthopnea, and PND.  All other systems are reviewed and otherwise negative.  Studies Reviewed: SABRA    EKG:  EKG is ordered today, personally reviewed, demonstrating ***     CV Studies: Cardiac studies reviewed are outlined and summarized above. Otherwise please see EMR for full report. Cardiac Studies & Procedures   ______________________________________________________________________________________________     ECHOCARDIOGRAM  ECHOCARDIOGRAM COMPLETE 04/18/2017  Narrative *[name]* [address] [place], [place] [address] [phone]  ------------------------------------------------------------------- Transthoracic Echocardiography  Patient:    Kathy [name], Kathy [name] MR #:       [id number] Study Date:  04/18/2017 Gender:     F Age:        10 Height:     160 cm Weight:     63.5 kg BSA:        1.69 m^2 Pt. Status: Room:  ATTENDING    Alm Clay, MD ORDERING     Alm Clay, MD REFERRING    Alm Clay, MD PERFORMING   [facility], Outpatient SONOGRAPHER  [facility], RDCS  cc:  ------------------------------------------------------------------- LV EF: 60% -   65%  ------------------------------------------------------------------- Indications:      Aortic Valve Disorder (I35.9).  ------------------------------------------------------------------- History:   PMH:  Palpitations  Murmur.  Risk factors:  Hypertension. Diabetes mellitus. Dyslipidemia.  ------------------------------------------------------------------- Study Conclusions  - Left ventricle: The cavity size was normal. Systolic function was normal. The estimated ejection fraction was in the range of 60% to 65%. Wall motion was normal; there were no regional wall motion abnormalities. Doppler parameters are consistent with abnormal left ventricular relaxation (grade 1 diastolic dysfunction). - Aortic valve: Trileaflet; mildly thickened, mildly calcified leaflets. There was mild regurgitation.  Impressions:  - Compared to the prior study, there has been no significant interval change.  ------------------------------------------------------------------- Study data:  Comparison was made to the study of 09/29/2013.  Study status:  Routine.  Procedure:  Transthoracic echocardiography. Image quality was adequate.          Transthoracic echocardiography.  M-mode, complete 2D, spectral Doppler, and color Doppler.  Birthdate:  Patient birthdate: 1941/01/10.  Age:  Patient is 83 yr old.  Sex:  Gender: female.    BMI: 24.8 kg/m^2.  Blood pressure:     142/66  Patient status:  Outpatient.  Study date: Study date: 04/18/2017. Study time: 10:48 AM.  Location:  [facility]  -------------------------------------------------------------------  ------------------------------------------------------------------- Left ventricle:  The cavity size was normal. Systolic function was normal. The estimated ejection fraction was in the range of 60% to 65%. Wall motion was normal; there were no regional wall motion abnormalities. Doppler parameters are consistent with abnormal left ventricular relaxation (grade 1 diastolic dysfunction).  ------------------------------------------------------------------- Aortic valve:   Trileaflet; mildly thickened, mildly calcified leaflets. Mobility was not restricted.  Doppler:  Transvalvular velocity was within the normal range. There was no stenosis. There was mild regurgitation.  ------------------------------------------------------------------- Aorta:  Aortic root: The aortic root was normal in size.  ------------------------------------------------------------------- Mitral valve:   Structurally normal valve.   Mobility was not restricted.  Doppler:  Transvalvular velocity was within the normal range. There was no evidence for stenosis. There was no regurgitation.    Peak gradient (D): 3 mm Hg.  ------------------------------------------------------------------- Left atrium:  The atrium was normal in size.  ------------------------------------------------------------------- Right ventricle:  The cavity size was normal. Wall thickness was normal. Systolic function was normal.  ------------------------------------------------------------------- Pulmonic valve:    Doppler:  Transvalvular velocity was within the normal range. There was no evidence for stenosis.  ------------------------------------------------------------------- Tricuspid valve:   Structurally normal valve.    Doppler: Transvalvular velocity was within the normal range. There was  no regurgitation.  ------------------------------------------------------------------- Pulmonary artery:   The main pulmonary artery was normal-sized. Systolic pressure was within the normal range.  ------------------------------------------------------------------- Right atrium:  The atrium was normal in size.  ------------------------------------------------------------------- Pericardium:  There was no pericardial effusion.  ------------------------------------------------------------------- Systemic veins: Inferior vena cava: The vessel was normal in size.  ------------------------------------------------------------------- Measurements  Left ventricle                           Value        Reference LV ID, ED, PLAX chordal          (L)     32    mm     43 - 52 LV ID, ES, PLAX chordal          (L)     19.1  mm     23 - 38 LV fx shortening, PLAX chordal           40    %      >=29 LV PW thickness, ED                      11.3  mm     ---------- IVS/LV PW ratio, ED                      0.93         <=1.3 Stroke volume, 2D                        113   ml     ---------- Stroke volume/bsa, 2D                    67    ml/m^2 ---------- LV e&', lateral                           8.7   cm/s   ---------- LV E/e&', lateral                         10.15        ----------  LV e&', medial                            7.62  cm/s   ---------- LV E/e&', medial                          11.59        ---------- LV e&', average                           8.16  cm/s   ---------- LV E/e&', average                         10.82        ----------  Ventricular septum                       Value        Reference IVS thickness, ED                        10.5  mm     ----------  LVOT                                     Value        Reference LVOT ID, S                               21    mm     ---------- LVOT area                                3.46  cm^2   ---------- LVOT peak velocity, S                     131   cm/s   ---------- LVOT mean velocity, S                    87.7  cm/s   ---------- LVOT VTI, S                              32.6  cm     ---------- LVOT peak gradient, S                    7     mm Hg  ----------  Aortic valve                             Value        Reference Aortic regurg pressure half-time         602   ms     ----------  Aorta                                    Value        Reference Aortic root ID, ED  25    mm     ---------- Ascending aorta ID, A-P, S               32    mm     ----------  Left atrium                              Value        Reference LA ID, A-P, ES                           36    mm     ---------- LA ID/bsa, A-P                           2.13  cm/m^2 <=2.2 LA volume, S                             38.8  ml     ---------- LA volume/bsa, S                         22.9  ml/m^2 ---------- LA volume, ES, 1-p A4C                   30.9  ml     ---------- LA volume/bsa, ES, 1-p A4C               18.3  ml/m^2 ---------- LA volume, ES, 1-p A2C                   40    ml     ---------- LA volume/bsa, ES, 1-p A2C               23.6  ml/m^2 ----------  Mitral valve                             Value        Reference Mitral E-wave peak velocity              88.3  cm/s   ---------- Mitral A-wave peak velocity              103   cm/s   ---------- Mitral deceleration time         (H)     278   ms     150 - 230 Mitral peak gradient, D                  3     mm Hg  ---------- Mitral E/A ratio, peak                   0.9          ----------  Tricuspid valve                          Value        Reference Tricuspid regurg peak velocity           198   cm/s   ---------- Tricuspid peak RV-RA gradient            16    mm Hg  ----------  Right atrium  Value        Reference RA ID, S-I, ES, A4C                      45    mm     34 - 49 RA area, ES, A4C                         12    cm^2   8.3 - 19.5 RA  volume, ES, A/L                       25.9  ml     ---------- RA volume/bsa, ES, A/L                   15.3  ml/m^2 ----------  Right ventricle                          Value        Reference TAPSE                                    24.7  mm     ---------- RV s&', lateral, S                        10.1  cm/s   ----------  Legend: (L)  and  (H)  mark values outside specified reference range.  ------------------------------------------------------------------- Prepared and Electronically Authenticated by  Oneil Parchment, M.D. 2018-08-01T16:00:50          ______________________________________________________________________________________________       Current Reported Medications:.    Active Medications[1]  Physical Exam:    VS:  There were no vitals taken for this visit.   Wt Readings from Last 3 Encounters:  03/03/24 134 lb 9.6 oz (61.1 kg)  09/20/23 134 lb 12.8 oz (61.1 kg)  02/27/23 134 lb 9.6 oz (61.1 kg)    GEN: Well nourished, well developed in no acute distress NECK: No JVD; No carotid bruits CARDIAC: ***RRR, no murmurs, rubs, gallops RESPIRATORY:  Clear to auscultation without rales, wheezing or rhonchi  ABDOMEN: Soft, non-tender, non-distended EXTREMITIES:  No edema; No acute deformity     Asessement and Plan:.     ***     Disposition: F/u with ***  Signed, Omesha Bowerman D Carylon Tamburro, NP      [1]  No outpatient medications have been marked as taking for the 09/16/24 encounter (Appointment) with Mele Sylvester D, NP.   "

## 2024-09-16 ENCOUNTER — Ambulatory Visit: Admitting: Cardiology

## 2024-09-16 DIAGNOSIS — E1169 Type 2 diabetes mellitus with other specified complication: Secondary | ICD-10-CM

## 2024-09-16 DIAGNOSIS — I10 Essential (primary) hypertension: Secondary | ICD-10-CM

## 2024-10-02 LAB — LAB REPORT - SCANNED
A1c: 6.3
EGFR: 44

## 2024-10-08 ENCOUNTER — Ambulatory Visit: Attending: Cardiology | Admitting: Cardiology

## 2024-10-08 ENCOUNTER — Encounter: Payer: Self-pay | Admitting: Cardiology

## 2024-10-08 VITALS — BP 136/58 | HR 58 | Ht 63.0 in | Wt 120.0 lb

## 2024-10-08 DIAGNOSIS — E785 Hyperlipidemia, unspecified: Secondary | ICD-10-CM

## 2024-10-08 DIAGNOSIS — I10 Essential (primary) hypertension: Secondary | ICD-10-CM | POA: Diagnosis not present

## 2024-10-08 DIAGNOSIS — E1169 Type 2 diabetes mellitus with other specified complication: Secondary | ICD-10-CM | POA: Diagnosis not present

## 2024-10-08 DIAGNOSIS — R002 Palpitations: Secondary | ICD-10-CM

## 2024-10-08 NOTE — Patient Instructions (Addendum)
 Medication Instructions:   No changes   *If you need a refill on your cardiac medications before your next appointment, please call your pharmacy*   Lab Work:   If you have not Had your cholesterol checked by Dr Clarice between now and your July 2026 appointment  pleas have fating Lipid panel done prior to next appointment    If you have labs (blood work) drawn today and your tests are completely normal, you will receive your results only by: MyChart Message (if you have MyChart) OR A paper copy in the mail If you have any lab test that is abnormal or we need to change your treatment, we will call you to review the results.   Testing/Procedures: Not needed   Follow-Up: At [facility], you and your health needs are our priority.  As part of our continuing mission to provide you with exceptional heart care, we have created designated Provider Care Teams.  These Care Teams include your primary Cardiologist (physician) and Advanced Practice Providers (APPs -  Physician Assistants and Nurse Practitioners) who all work together to provide you with the care you need, when you need it.     Your next appointment:   6 month(s)  The format for your next appointment:   In Person  Provider:   Alm Clay, MD

## 2024-10-08 NOTE — Progress Notes (Signed)
 " Cardiology Office Note:  .   Date:  10/12/2024  ID:  Kathy [name], Kathy [name] December 09, 1940, MRN [id number] PCP: Clarice Nottingham, MD  [facility] Providers Cardiologist:  Alm Clay, MD     Chief Complaint  Patient presents with   Follow-up    12-month follow-up per patient request.  Doing well.  Stopped Nexletol because of concerns of cramping.    Patient Profile: .     Kathy [name] is a 84 y.o. female with a PMH notable for labile HTN, HLD who presents here for 6-month follow-up at the request of Clarice Nottingham, MD.  She has been followed for her labile blood pressure mostly ever since her husband Smitty was being followed by me for severe CAD and ischemic cardiomyopathy.  He died several years ago but she is continue to follow-up.  She has multiple medication intolerances, especially with different types of generic formulations . -This is a usually prolonged because she has multiple potentially noncardiac issues that she usually likes to at least bring up with me and discuss to hear my opinion or advice.  Today's visit was 29 minutes.   Kathy [name] was last seen on 02/19/2024.  She was doing well with no major issues.  She was open to get started on Ozempic.  Reiterated the need of staying adequately hydrated while on Ozempic.  No changes made.   Subjective  Discussed the use of AI scribe software for clinical note transcription with the patient, who gave verbal consent to proceed.  History of Present Illness Kathy [name] is an 84 year old female with hypertension and hyperlipidemia who presents for a follow-up visit.  She reports that her blood pressure was 120/80 mmHg at a recent visit with another doctor and attributes any recent elevations to stress from navigating to the appointment. Previously, her blood pressure was 120/80 mmHg at a recent visit with another doctor. She experiences occasional palpitations triggered by anxiety. No chest pain, shortness of  breath, or leg swelling.  Her A1c was recently checked and was 6.1%. She is currently on Ozempic for diabetes management and has lost weight, now weighing 120 lbs from a previous 133 lbs. She plans to reduce the dose if her weight reaches 118 lbs. She is also on Jardiance 25 mg daily and has stopped metformin.  She has a history of hyperlipidemia and is currently on Zetia. She previously tried Nexletol but experienced severe cramps and dryness, leading her to stop it. Her LDL was last recorded at 123 mg/dL.  She is agreeable to retry Nexletol.  She has a history of depression and was previously on Prozac, which she has stopped. She is currently trying a new medication for depression, which she will evaluate over five weeks. She reports a lack of interest in activities she used to enjoy, such as sewing and selling online, and attributes this to her depression.  She maintains an active lifestyle, walking regularly and using an exercise machine at home. She avoids outdoor activities in cold weather. She is planning to have cataract surgery soon.     Objective   Active Medications[1] -- Has stopped Nexletol -- cramping & nasal drying   Studies Reviewed: SABRA   EKG Interpretation Date/Time:  Wednesday October 08 2024 10:41:07 EST Ventricular Rate:  81 PR Interval:  146 QRS Duration:  72 QT Interval:  376 QTC Calculation: 436 R Axis:   -11  Text Interpretation: Normal sinus rhythm Nonspecific ST abnormality When compared with ECG of  20-Sep-2023 10:17, No significant change was found Confirmed by Anner Lenis ([id number]) on 10/08/2024 10:52:53 AM     Results LABS 10/02/2024: A1c 6.1; TC 204, TG 121, HDL 60, LDL 123 (had been 896 in 2025)  Past Surgical History:  Procedure Date   TRANSTHORACIC ECHOCARDIOGRAM a) 2015 - NormalLV size & function, EF 60-65% with Gr 1 DD, mild Ao Sclerosis with mild-mod AI;; b) no significant change January 2015, August 2018    Risk Assessment/Calculations:            Physical Exam:   VS:  BP (!) 136/58   Pulse (!) 58   Ht 5' 3 (1.6 m)   Wt 120 lb (54.4 kg)   SpO2 99%   BMI 21.26 kg/m    Wt Readings from Last 3 Encounters:  10/08/24 120 lb (54.4 kg)  03/03/24 134 lb 9.6 oz (61.1 kg)  09/20/23 134 lb 12.8 oz (61.1 kg)     GEN: Well nourished, well groomed; in no acute distress; healthy appearing NECK: No JVD; No carotid bruits CARDIAC: Normal S1, S2; RRR, soft 1/6 SEM @ RUSB; No rubs, gallops RESPIRATORY:  Clear to auscultation without rales, wheezing or rhonchi ; nonlabored, good air movement. ABDOMEN: Soft, non-tender, non-distended EXTREMITIES:  No edema; No deformity      ASSESSMENT AND PLAN: .   Moderate essential hypertension Blood pressure elevated today; previously 120/80 mmHg. On Bystolic  (tradename) 20 mg daily along with amlodipine  5 mg daily.  Remains uncomfortable with mail-order pharmacy, and with generics. - Refilled Bystolic  10 mg prescription. - Continue amlodipine -5 mg - Provided card for in-house pharmacy consultation.  Dyslipidemia associated with type 2 diabetes mellitus (HCC) LDL cholesterol elevated at 123 mg/dL. Experienced muscle cramps with Nexletol, considering retry. Previously on Crestor  with issues. On Ozempic, resulting in weight loss. Motivated to manage cholesterol for heart health. - Retry Nexletol 180 mg daily, monitor for muscle cramps. - Continue Zetia 10 mL daily along with Jardiance 12.5 mg twice daily - Continue Ozempic for diabetes management. - Consider alternative lipid-lowering therapy if Nexletol not tolerated.  Labs to be monitored by PCP.  Heart palpitations Occasional palpitations, possibly anxiety-related. No chest pain, pressure, or syncope. Manages anxiety with stress identification and calming techniques. -=Continue Bystolic    Orders Placed This Encounter  Procedures   Lipid panel   EKG 12-Lead    No orders of the defined types were placed in this encounter. -Restart  Nexletol.       Follow-Up: Return in about 6 months (around 04/07/2025) for [employer].  I spent 44 minutes in the care of Kathy [name] today including reviewing outside labs from PCP via [facility] (on minute), reviewing studies (1 minute reviewing prior echo), face to face time discussing treatment options (33 minutes), reviewing records from previous notes (1 minute), 8 minutes dictating, and documenting in the encounter.  As usual, she has much to discuss with multiple social issues that are affecting her health.  Very prolonged visits.  Portions of this note were dictated using DRAGON voice recognition software. Please disregard any errors in transcription. This record has been created using Conservation officer, historic buildings. Errors have been sought and corrected, but may not always be located. Such creation errors do not reflect on the standard of medical care.      Signed, Lenis MICAEL Anner, MD, MS Lenis Anner, M.D., M.S. Interventional Cardiologist  [facility] Pager # [phone]          [1]  Current Meds  Medication Sig   amLODipine  (NORVASC ) 5 MG tablet Take 1 tablet (5 mg total) by mouth daily. ( morning)   Blood Glucose Monitoring Suppl (ONETOUCH VERIO REFLECT) w/Device KIT 3 (three) times daily.   BYSTOLIC  20 MG TABS TAKE 1 TABLET BY MOUTH EVERY DAY   Cholecalciferol (VITAMIN D3) 2000 UNITS capsule Take 5,000 Units by mouth daily.    EPINEPHrine (EPIPEN 2-PAK) 0.3 mg/0.3 mL IJ SOAJ injection Inject 0.3 mg into the muscle as needed.   ezetimibe (ZETIA) 10 MG tablet Take 10 mg by mouth daily.   Multiple Vitamin (MULTIVITAMIN) tablet Take 1 tablet by mouth daily.   Omega-3 Fatty Acids (EMULSIFIED OMEGA-3) [phone] MG/15ML LIQD Takes 1 tablespon by mouth daily.   ONETOUCH VERIO test strip 1 each by Other route 3 (three) times daily.   OZEMPIC, 0.25 OR 0.5 MG/DOSE, 2 MG/3ML SOPN Inject 0.5 mg weekly   "

## 2024-10-12 ENCOUNTER — Encounter: Payer: Self-pay | Admitting: Cardiology

## 2024-10-12 NOTE — Assessment & Plan Note (Addendum)
 LDL cholesterol elevated at 123 mg/dL. Experienced muscle cramps with Nexletol, considering retry. Previously on Crestor  with issues. On Ozempic, resulting in weight loss. Motivated to manage cholesterol for heart health. - Retry Nexletol 180 mg daily, monitor for muscle cramps. - Continue Zetia 10 mL daily along with Jardiance 12.5 mg twice daily - Continue Ozempic for diabetes management. - Consider alternative lipid-lowering therapy if Nexletol not tolerated.  Labs to be monitored by PCP.

## 2024-10-12 NOTE — Assessment & Plan Note (Signed)
 Blood pressure elevated today; previously 120/80 mmHg. On Bystolic  (tradename) 20 mg daily along with amlodipine  5 mg daily.  Remains uncomfortable with mail-order pharmacy, and with generics. - Refilled Bystolic  10 mg prescription. - Continue amlodipine -5 mg - Provided card for in-house pharmacy consultation.

## 2024-10-12 NOTE — Assessment & Plan Note (Signed)
 Occasional palpitations, possibly anxiety-related. No chest pain, pressure, or syncope. Manages anxiety with stress identification and calming techniques. -=Continue Bystolic 

## 2025-03-11 ENCOUNTER — Ambulatory Visit: Admitting: Cardiology
